# Patient Record
Sex: Male | Born: 1960
Health system: Southern US, Community
[De-identification: ages and names within clinical notes are randomized; demographics above are authoritative.]

## PROBLEM LIST (undated history)

## (undated) DIAGNOSIS — B029 Zoster without complications: Secondary | ICD-10-CM

## (undated) DIAGNOSIS — M1710 Unilateral primary osteoarthritis, unspecified knee: Secondary | ICD-10-CM

## (undated) DIAGNOSIS — R591 Generalized enlarged lymph nodes: Secondary | ICD-10-CM

## (undated) DIAGNOSIS — Z8719 Personal history of other diseases of the digestive system: Secondary | ICD-10-CM

## (undated) DIAGNOSIS — Z77090 Contact with and (suspected) exposure to asbestos: Secondary | ICD-10-CM

## (undated) DIAGNOSIS — G43909 Migraine, unspecified, not intractable, without status migrainosus: Secondary | ICD-10-CM

## (undated) DIAGNOSIS — J309 Allergic rhinitis, unspecified: Secondary | ICD-10-CM

## (undated) DIAGNOSIS — I10 Essential (primary) hypertension: Secondary | ICD-10-CM

## (undated) DIAGNOSIS — E669 Obesity, unspecified: Secondary | ICD-10-CM

## (undated) DIAGNOSIS — M109 Gout, unspecified: Secondary | ICD-10-CM

## (undated) DIAGNOSIS — M179 Osteoarthritis of knee, unspecified: Secondary | ICD-10-CM

## (undated) DIAGNOSIS — N200 Calculus of kidney: Secondary | ICD-10-CM

## (undated) DIAGNOSIS — F419 Anxiety disorder, unspecified: Secondary | ICD-10-CM

## (undated) DIAGNOSIS — G4730 Sleep apnea, unspecified: Secondary | ICD-10-CM

## (undated) DIAGNOSIS — Z8711 Personal history of peptic ulcer disease: Secondary | ICD-10-CM

## (undated) DIAGNOSIS — J45909 Unspecified asthma, uncomplicated: Secondary | ICD-10-CM

## (undated) DIAGNOSIS — D860 Sarcoidosis of lung: Secondary | ICD-10-CM

## (undated) DIAGNOSIS — J40 Bronchitis, not specified as acute or chronic: Secondary | ICD-10-CM

## (undated) DIAGNOSIS — S4290XA Fracture of unspecified shoulder girdle, part unspecified, initial encounter for closed fracture: Secondary | ICD-10-CM

## (undated) HISTORY — DX: Anxiety disorder, unspecified: F41.9

## (undated) HISTORY — DX: Sleep apnea, unspecified: G47.30

## (undated) HISTORY — DX: Contact with and (suspected) exposure to asbestos: Z77.090

## (undated) HISTORY — DX: Allergic rhinitis, unspecified: J30.9

## (undated) HISTORY — DX: Gout, unspecified: M10.9

## (undated) HISTORY — DX: Personal history of other diseases of the digestive system: Z87.19

## (undated) HISTORY — PX: TONSILLECTOMY: SUR1361

## (undated) HISTORY — DX: Zoster without complications: B02.9

## (undated) HISTORY — DX: Obesity, unspecified: E66.9

## (undated) HISTORY — DX: Osteoarthritis of knee, unspecified: M17.9

## (undated) HISTORY — PX: OTHER SURGICAL HISTORY: SHX169

## (undated) HISTORY — DX: Calculus of kidney: N20.0

## (undated) HISTORY — DX: Personal history of peptic ulcer disease: Z87.11

## (undated) HISTORY — PX: POLYPECTOMY: SHX149

## (undated) HISTORY — DX: Unilateral primary osteoarthritis, unspecified knee: M17.10

## (undated) HISTORY — PX: WISDOM TOOTH EXTRACTION: SHX21

## (undated) HISTORY — PX: TESTICLE SURGERY: SHX794

## (undated) HISTORY — DX: Essential (primary) hypertension: I10

---

## 1998-12-10 ENCOUNTER — Encounter: Payer: Self-pay | Admitting: Emergency Medicine

## 1998-12-10 ENCOUNTER — Emergency Department (HOSPITAL_COMMUNITY): Admission: EM | Admit: 1998-12-10 | Discharge: 1998-12-10 | Payer: Self-pay | Admitting: Emergency Medicine

## 1998-12-20 ENCOUNTER — Encounter: Admission: RE | Admit: 1998-12-20 | Discharge: 1998-12-20 | Payer: Self-pay | Admitting: *Deleted

## 1999-10-11 ENCOUNTER — Emergency Department (HOSPITAL_COMMUNITY): Admission: EM | Admit: 1999-10-11 | Discharge: 1999-10-11 | Payer: Self-pay | Admitting: Emergency Medicine

## 1999-10-11 ENCOUNTER — Encounter: Payer: Self-pay | Admitting: Emergency Medicine

## 1999-10-14 ENCOUNTER — Encounter: Payer: Self-pay | Admitting: *Deleted

## 1999-10-15 ENCOUNTER — Inpatient Hospital Stay (HOSPITAL_COMMUNITY): Admission: EM | Admit: 1999-10-15 | Discharge: 1999-10-16 | Payer: Self-pay | Admitting: Pediatrics

## 1999-10-15 ENCOUNTER — Encounter: Payer: Self-pay | Admitting: *Deleted

## 1999-10-17 ENCOUNTER — Encounter: Payer: Self-pay | Admitting: *Deleted

## 1999-10-17 ENCOUNTER — Ambulatory Visit (HOSPITAL_COMMUNITY): Admission: RE | Admit: 1999-10-17 | Discharge: 1999-10-17 | Payer: Self-pay | Admitting: *Deleted

## 1999-10-25 ENCOUNTER — Ambulatory Visit (HOSPITAL_COMMUNITY): Admission: RE | Admit: 1999-10-25 | Discharge: 1999-10-25 | Payer: Self-pay | Admitting: *Deleted

## 1999-10-25 ENCOUNTER — Encounter: Payer: Self-pay | Admitting: *Deleted

## 1999-12-02 ENCOUNTER — Encounter: Payer: Self-pay | Admitting: *Deleted

## 1999-12-02 ENCOUNTER — Ambulatory Visit (HOSPITAL_COMMUNITY): Admission: RE | Admit: 1999-12-02 | Discharge: 1999-12-02 | Payer: Self-pay | Admitting: *Deleted

## 2000-05-30 ENCOUNTER — Encounter: Payer: Self-pay | Admitting: Emergency Medicine

## 2000-05-30 ENCOUNTER — Emergency Department (HOSPITAL_COMMUNITY): Admission: EM | Admit: 2000-05-30 | Discharge: 2000-05-30 | Payer: Self-pay | Admitting: Emergency Medicine

## 2000-06-04 ENCOUNTER — Encounter: Payer: Self-pay | Admitting: *Deleted

## 2000-06-04 ENCOUNTER — Encounter: Admission: RE | Admit: 2000-06-04 | Discharge: 2000-06-04 | Payer: Self-pay | Admitting: *Deleted

## 2003-02-21 ENCOUNTER — Emergency Department (HOSPITAL_COMMUNITY): Admission: EM | Admit: 2003-02-21 | Discharge: 2003-02-21 | Payer: Self-pay | Admitting: Emergency Medicine

## 2004-09-26 ENCOUNTER — Emergency Department (HOSPITAL_COMMUNITY): Admission: EM | Admit: 2004-09-26 | Discharge: 2004-09-26 | Payer: Self-pay | Admitting: Emergency Medicine

## 2006-02-14 ENCOUNTER — Emergency Department (HOSPITAL_COMMUNITY): Admission: EM | Admit: 2006-02-14 | Discharge: 2006-02-14 | Payer: Self-pay | Admitting: Emergency Medicine

## 2012-01-23 ENCOUNTER — Institutional Professional Consult (permissible substitution) (INDEPENDENT_AMBULATORY_CARE_PROVIDER_SITE_OTHER): Payer: BC Managed Care – PPO | Admitting: Thoracic Surgery (Cardiothoracic Vascular Surgery)

## 2012-01-23 ENCOUNTER — Other Ambulatory Visit: Payer: Self-pay | Admitting: *Deleted

## 2012-01-23 ENCOUNTER — Other Ambulatory Visit: Payer: Self-pay

## 2012-01-23 VITALS — BP 150/97 | HR 124 | Resp 20 | Ht 69.0 in | Wt 266.0 lb

## 2012-01-23 DIAGNOSIS — R599 Enlarged lymph nodes, unspecified: Secondary | ICD-10-CM

## 2012-01-23 DIAGNOSIS — R590 Localized enlarged lymph nodes: Secondary | ICD-10-CM

## 2012-01-23 NOTE — Progress Notes (Signed)
PCP is Ezequiel Kayser, MD Referring Provider is Perini, [facility], MD  Chief Complaint  Patient presents with  . Lymphadenopathy    Referral from Dr Waynard Edwards for surgical eval on mediastinal and bilateral hilar lymphadenopathy, Chest CT 01/08/12    HPI: 51 year old male presents with chief complaint of enlarged lymph nodes.  Mr. Hathorne is a 51 year old with a moderate history of tobacco use and some were placed related asbestos exposure 25 years ago. He was in his usual state of health until this past spring when he was noted frequent cough, wheezing, and eye irritation. This was unusual for him as he has never had allergies prior to that time. He went to an urgent care was told was allergies. He was given an inhaler, which did improve his symptoms. But he would still wheeze at night and often snored louder than usual. His cough also never really resolved. If I would see Dr. Waynard Edwards. Chest x-ray was done which showed prominent hila bilaterally. This led to a CT of the chest on 01/08/2012 which showed extensive bilateral hilar and mediastinal adenopathy, as well as a mild lingular infiltrate.  He denies fevers, chills, and night sweats. He does complain of a tight feeling in his chest. He says he gets this sensation at the end of the day when he is tired. It is not exertion related. It usually is 2-3 on a scale of 10. He does seem to have more that sensation when he's been coughing frequently. He has no history of cardiac disease. He does not have any exposures to birds, construction sites or unusual travel locations. His weight has been stable.   Past Medical History  Diagnosis Date  . Shingles     on face, age 36yo and again at 51yo   . Gout   . Nephrolithiasis   . OA (osteoarthritis) of knee     bilat  . Allergic rhinitis   . Hypertension   . Anxiety   . History of stomach ulcers   . Obesity   . H/O asbestos exposure     Past Surgical History  Procedure Date  . Kidney stones   . Testicle  surgery   . Tonsillectomy     Family History  Problem Relation Age of Onset  . COPD Mother   . COPD Father   . Hypertension Mother   . Hypertension Father   . Hypertension Brother     Social History History  Substance Use Topics  . Smoking status: Former Smoker -- 1.5 packs/day for 20 years    Types: Cigarettes    Quit date: 04/17/2009  . Smokeless tobacco: Never Used  . Alcohol Use: Yes     rare    Current Outpatient Prescriptions  Medication Sig Dispense Refill  . allopurinol (ZYLOPRIM) 300 MG tablet Take 300 mg by mouth daily.       Marland Kitchen amLODipine (NORVASC) 5 MG tablet Take 5 mg by mouth daily.       . fenofibrate 160 MG tablet Take 160 mg by mouth daily.       Marland Kitchen losartan (COZAAR) 100 MG tablet Take 100 mg by mouth daily.       Marland Kitchen PROAIR HFA 108 (90 BASE) MCG/ACT inhaler Inhale 2 puffs into the lungs every 6 (six) hours as needed.       Marland Kitchen QVAR 80 MCG/ACT inhaler Inhale 2 puffs into the lungs 2 (two) times daily.         No Known Allergies  Review  of Systems  Respiratory: Positive for cough, chest tightness (center of chest at end of day when I'm tired, not exertional) and wheezing (since this past Spring).        Exposure to asbestos in workplace 25 years ago for 1.5 years  Psychiatric/Behavioral:       Very anxious about CT findings  All other systems reviewed and are negative.    BP 150/97  Pulse 124  Resp 20  Ht 5\' 9"  (1.753 m)  Wt 266 lb (120.657 kg)  BMI 39.28 kg/m2  SpO2 96% Physical Exam  Vitals reviewed. Constitutional: He is oriented to person, place, and time. He appears well-developed and well-nourished. No distress.  HENT:  Head: Normocephalic and atraumatic.  Eyes: EOM are normal. Pupils are equal, round, and reactive to light.  Neck: Neck supple. No thyromegaly present.  Cardiovascular: Normal rate, regular rhythm, normal heart sounds and intact distal pulses.  Exam reveals no gallop and no friction rub.   No murmur heard. Pulmonary/Chest:  Effort normal and breath sounds normal. No respiratory distress. He has no wheezes.  Abdominal: Soft. There is no tenderness.  Musculoskeletal: He exhibits no edema.  Lymphadenopathy:    He has no cervical adenopathy.  Neurological: He is alert and oriented to person, place, and time. No cranial nerve deficit.  Skin: Skin is warm and dry.     Diagnostic Tests: Ct Chest 01/08/12 Impression: 1. Extensive mediastinal and bilateral hilar lymphadenopathy, or severely involving the subcarinal region. Findings are nonspecific. Differential considerations include : lymphoma and other immunoproliferative disorders, metastatic disease, inflammatory processes such as sarcoidosis as well as reactive infectious adenopathy. Pathologic correlation/biopsy is needed. 2. Mild lingular infiltrate    Impression: 51 year old male with mediastinal and bilateral hilar adenopathy. The differential diagnosis for this includes lymphoma, sarcoidosis, TB or fungal infection, metastatic disease, and reactive adenopathy due to infection or inflammatory process. Of these I think lymphoma and sarcoidosis of the 2 main issues in the differential, with sarcoidosis being the most likely. Because of this I think the best way to approach this diagnostic workup would be with mediastinoscopy. EBUS could access the nodes, but is unlikely to yield either of the 2 most likely diagnoses. On the other hand with mediastinoscopy we can obtain large enough tissue samples to get a definitive diagnosis.  I discussed in detail with the patient and Mrs. Kittrell the indications, risks, benefits, and alternatives for mediastinoscopy. They understand the general nature of the procedure, the incision to be used, the need for general anesthesia, the expectation for this being an outpatient procedure as well as being able to return to work within a few days. I discussed in detail with them the risks which they understand include, but are not limited to,  death, stroke, bleeding, possible need for transfusion or thoracotomy, recurrent nerve injury resulting in hoarseness, pneumothorax, esophageal injury, as well as the possibility of unforeseeable complications. He accepts these risks and wishes to proceed.   Plan: Mediastinoscopy as outpatient on Monday, October 21.

## 2012-01-26 ENCOUNTER — Encounter (HOSPITAL_COMMUNITY): Payer: Self-pay | Admitting: Pharmacy Technician

## 2012-01-31 ENCOUNTER — Encounter (HOSPITAL_COMMUNITY)
Admission: RE | Admit: 2012-01-31 | Discharge: 2012-01-31 | Disposition: A | Discharge status: Home / Self Care | Payer: BC Managed Care – PPO | Source: Ambulatory Visit | Attending: Thoracic Surgery (Cardiothoracic Vascular Surgery) | Admitting: Thoracic Surgery (Cardiothoracic Vascular Surgery)

## 2012-01-31 ENCOUNTER — Encounter (HOSPITAL_COMMUNITY): Payer: Self-pay

## 2012-01-31 VITALS — BP 132/85 | HR 110 | Temp 99.0°F | Resp 20 | Ht 69.0 in | Wt 274.1 lb

## 2012-01-31 DIAGNOSIS — R590 Localized enlarged lymph nodes: Secondary | ICD-10-CM

## 2012-01-31 HISTORY — DX: Migraine, unspecified, not intractable, without status migrainosus: G43.909

## 2012-01-31 HISTORY — DX: Unspecified asthma, uncomplicated: J45.909

## 2012-01-31 HISTORY — DX: Fracture of unspecified shoulder girdle, part unspecified, initial encounter for closed fracture: S42.90XA

## 2012-01-31 HISTORY — DX: Bronchitis, not specified as acute or chronic: J40

## 2012-01-31 LAB — COMPREHENSIVE METABOLIC PANEL
ALT: 43 U/L (ref 0–53)
AST: 26 U/L (ref 0–37)
Albumin: 3.5 g/dL (ref 3.5–5.2)
Alkaline Phosphatase: 69 U/L (ref 39–117)
BUN: 13 mg/dL (ref 6–23)
Chloride: 103 mEq/L (ref 96–112)
Potassium: 4.2 mEq/L (ref 3.5–5.1)
Sodium: 137 mEq/L (ref 135–145)
Total Bilirubin: 1.1 mg/dL (ref 0.3–1.2)
Total Protein: 6.8 g/dL (ref 6.0–8.3)

## 2012-01-31 LAB — CBC
HCT: 45.6 % (ref 39.0–52.0)
MCHC: 34.2 g/dL (ref 30.0–36.0)
RDW: 14.6 % (ref 11.5–15.5)
WBC: 7.3 10*3/uL (ref 4.0–10.5)

## 2012-01-31 LAB — TYPE AND SCREEN: Antibody Screen: NEGATIVE

## 2012-01-31 LAB — SURGICAL PCR SCREEN
MRSA, PCR: NEGATIVE
Staphylococcus aureus: POSITIVE — AB

## 2012-01-31 LAB — ABO/RH: ABO/RH(D): O POS

## 2012-01-31 LAB — PROTIME-INR: Prothrombin Time: 13.2 seconds (ref 11.6–15.2)

## 2012-01-31 LAB — APTT: aPTT: 31 seconds (ref 24–37)

## 2012-01-31 NOTE — Pre-Procedure Instructions (Signed)
[facility]  01/31/2012   Your procedure is scheduled on:  Monday February 05, 2012.  [facility] at 0530 AM.  Call this number if you have problems the morning of surgery: [phone]   Remember:   Do not eat food or drink:After Midnight.    Take these medicines the morning of surgery with A SIP OF WATER: Amlodipine (Norvasc), Proair inhaler if needed for shortness of breath, and Qvar inhaler   Do not wear jewelry  Do not wear lotions or colognes.  Men may shave face and neck.  Do not bring valuables to the hospital.  Contacts, dentures or bridgework may not be worn into surgery.  Leave suitcase in the car. After surgery it may be brought to your room.  For patients admitted to the hospital, checkout time is 11:00 AM the day of discharge.   Patients discharged the day of surgery will not be allowed to drive home.  Name and phone number of your driver:   Special Instructions: Shower using CHG 2 nights before surgery and the night before surgery.  If you shower the day of surgery use CHG.  Use special wash - you have one bottle of CHG for all showers.  You should use approximately 1/3 of the bottle for each shower.   Please read over the following fact sheets that you were given: Pain Booklet, Coughing and Deep Breathing, Blood Transfusion Information, MRSA Information and Surgical Site Infection Prevention

## 2012-01-31 NOTE — Progress Notes (Signed)
Patient denied having a stress test, cardiac cath, or sleep study. PCP is Rodrigo Ran at [facility] office # [phone] and LOV was approximately 3 weeks ago. Will request LOV and EKG. Chest Xray will be performed on DOS per orders.

## 2012-02-04 MED ORDER — DEXTROSE 5 % IV SOLN
1.5000 g | INTRAVENOUS | Status: AC
  Administered 2012-02-05: 1.5 g via INTRAVENOUS
  Filled 2012-02-04: qty 1.5

## 2012-02-05 ENCOUNTER — Ambulatory Visit (HOSPITAL_COMMUNITY)
Admission: RE | Admit: 2012-02-05 | Discharge: 2012-02-05 | Disposition: A | Discharge status: Home / Self Care | Payer: BC Managed Care – PPO | Source: Ambulatory Visit | Attending: Thoracic Surgery (Cardiothoracic Vascular Surgery) | Admitting: Thoracic Surgery (Cardiothoracic Vascular Surgery)

## 2012-02-05 ENCOUNTER — Encounter (HOSPITAL_COMMUNITY)
Admission: RE | Disposition: A | Discharge status: Home / Self Care | Payer: Self-pay | Source: Ambulatory Visit | Attending: Thoracic Surgery (Cardiothoracic Vascular Surgery)

## 2012-02-05 ENCOUNTER — Encounter (HOSPITAL_COMMUNITY): Payer: Self-pay | Admitting: Anesthesiology

## 2012-02-05 ENCOUNTER — Ambulatory Visit (HOSPITAL_COMMUNITY): Payer: BC Managed Care – PPO

## 2012-02-05 ENCOUNTER — Ambulatory Visit (HOSPITAL_COMMUNITY): Payer: BC Managed Care – PPO | Admitting: Anesthesiology

## 2012-02-05 DIAGNOSIS — Z01812 Encounter for preprocedural laboratory examination: Secondary | ICD-10-CM | POA: Insufficient documentation

## 2012-02-05 DIAGNOSIS — R599 Enlarged lymph nodes, unspecified: Secondary | ICD-10-CM | POA: Insufficient documentation

## 2012-02-05 DIAGNOSIS — I889 Nonspecific lymphadenitis, unspecified: Secondary | ICD-10-CM | POA: Insufficient documentation

## 2012-02-05 DIAGNOSIS — R590 Localized enlarged lymph nodes: Secondary | ICD-10-CM

## 2012-02-05 HISTORY — PX: MEDIASTINOSCOPY: SHX5086

## 2012-02-05 LAB — GLUCOSE, CAPILLARY: Glucose-Capillary: 134 mg/dL — ABNORMAL HIGH (ref 70–99)

## 2012-02-05 SURGERY — MEDIASTINOSCOPY
Anesthesia: General | Wound class: Clean Contaminated

## 2012-02-05 MED ORDER — PROPOFOL 10 MG/ML IV BOLUS
INTRAVENOUS | Status: DC | PRN
  Administered 2012-02-05: 160 mg via INTRAVENOUS

## 2012-02-05 MED ORDER — OXYCODONE HCL 5 MG PO TABS
5.0000 mg | ORAL_TABLET | Freq: Once | ORAL | Status: AC | PRN
  Administered 2012-02-05: 5 mg via ORAL

## 2012-02-05 MED ORDER — PHENYLEPHRINE HCL 10 MG/ML IJ SOLN
INTRAMUSCULAR | Status: DC | PRN
  Administered 2012-02-05: 40 ug via INTRAVENOUS
  Administered 2012-02-05 (×4): 80 ug via INTRAVENOUS
  Administered 2012-02-05: 40 ug via INTRAVENOUS

## 2012-02-05 MED ORDER — FENTANYL CITRATE 0.05 MG/ML IJ SOLN: 50.0000 ug | Freq: Once | INTRAMUSCULAR | Status: DC

## 2012-02-05 MED ORDER — ACETAMINOPHEN 10 MG/ML IV SOLN: 1000.0000 mg | Freq: Once | INTRAVENOUS | Status: DC

## 2012-02-05 MED ORDER — ESMOLOL HCL 10 MG/ML IV SOLN
INTRAVENOUS | Status: DC | PRN
  Administered 2012-02-05 (×3): 20 mg via INTRAVENOUS

## 2012-02-05 MED ORDER — LACTATED RINGERS IV SOLN
INTRAVENOUS | Status: DC | PRN
  Administered 2012-02-05: 07:00:00 via INTRAVENOUS

## 2012-02-05 MED ORDER — OXYCODONE HCL 5 MG PO TABS
ORAL_TABLET | ORAL | Status: AC
  Filled 2012-02-05: qty 1

## 2012-02-05 MED ORDER — HYDROMORPHONE HCL PF 1 MG/ML IJ SOLN: 0.2500 mg | INTRAMUSCULAR | Status: DC | PRN

## 2012-02-05 MED ORDER — PROMETHAZINE HCL 25 MG/ML IJ SOLN: 6.2500 mg | INTRAMUSCULAR | Status: DC | PRN

## 2012-02-05 MED ORDER — LIDOCAINE HCL (CARDIAC) 20 MG/ML IV SOLN
INTRAVENOUS | Status: DC | PRN
  Administered 2012-02-05: 100 mg via INTRAVENOUS

## 2012-02-05 MED ORDER — MIDAZOLAM HCL 2 MG/2ML IJ SOLN: 1.0000 mg | INTRAMUSCULAR | Status: DC | PRN

## 2012-02-05 MED ORDER — OXYCODONE HCL 5 MG PO TABS: 5.0000 mg | ORAL_TABLET | Freq: Four times a day (QID) | ORAL | Status: DC | PRN

## 2012-02-05 MED ORDER — INSULIN ASPART 100 UNIT/ML ~~LOC~~ SOLN
10.0000 [IU] | SUBCUTANEOUS | Status: DC
  Filled 2012-02-05: qty 3

## 2012-02-05 MED ORDER — MIDAZOLAM HCL 5 MG/5ML IJ SOLN
INTRAMUSCULAR | Status: DC | PRN
  Administered 2012-02-05 (×2): 1 mg via INTRAVENOUS

## 2012-02-05 MED ORDER — ARTIFICIAL TEARS OP OINT
TOPICAL_OINTMENT | OPHTHALMIC | Status: DC | PRN
  Administered 2012-02-05: 1 via OPHTHALMIC

## 2012-02-05 MED ORDER — OXYCODONE HCL 5 MG/5ML PO SOLN: 5.0000 mg | Freq: Once | ORAL | Status: AC | PRN

## 2012-02-05 MED ORDER — ONDANSETRON HCL 4 MG/2ML IJ SOLN
INTRAMUSCULAR | Status: DC | PRN
  Administered 2012-02-05: 4 mg via INTRAVENOUS

## 2012-02-05 MED ORDER — GLYCOPYRROLATE 0.2 MG/ML IJ SOLN
INTRAMUSCULAR | Status: DC | PRN
  Administered 2012-02-05: .8 mg via INTRAVENOUS

## 2012-02-05 MED ORDER — SODIUM CHLORIDE 0.9 % IR SOLN
Status: DC | PRN
  Administered 2012-02-05: 1000 mL

## 2012-02-05 MED ORDER — ROCURONIUM BROMIDE 100 MG/10ML IV SOLN
INTRAVENOUS | Status: DC | PRN
  Administered 2012-02-05: 50 mg via INTRAVENOUS

## 2012-02-05 MED ORDER — FENTANYL CITRATE 0.05 MG/ML IJ SOLN
INTRAMUSCULAR | Status: DC | PRN
  Administered 2012-02-05 (×3): 50 ug via INTRAVENOUS
  Administered 2012-02-05: 25 ug via INTRAVENOUS
  Administered 2012-02-05: 75 ug via INTRAVENOUS

## 2012-02-05 MED ORDER — NEOSTIGMINE METHYLSULFATE 1 MG/ML IJ SOLN
INTRAMUSCULAR | Status: DC | PRN
  Administered 2012-02-05: 5 mg via INTRAVENOUS

## 2012-02-05 SURGICAL SUPPLY — 47 items
APPLIER CLIP LOGIC TI 5 (MISCELLANEOUS) IMPLANT
BLADE SURG 15 STRL LF DISP TIS (BLADE) ×1 IMPLANT
BLADE SURG 15 STRL SS (BLADE) ×1
CANISTER SUCTION 2500CC (MISCELLANEOUS) ×2 IMPLANT
CLIP TI MEDIUM 6 (CLIP) ×2 IMPLANT
CLOTH BEACON ORANGE TIMEOUT ST (SAFETY) ×2 IMPLANT
CONT SPEC 4OZ CLIKSEAL STRL BL (MISCELLANEOUS) ×12 IMPLANT
COVER SURGICAL LIGHT HANDLE (MISCELLANEOUS) ×2 IMPLANT
DERMABOND ADVANCED (GAUZE/BANDAGES/DRESSINGS) ×1
DERMABOND ADVANCED .7 DNX12 (GAUZE/BANDAGES/DRESSINGS) ×1 IMPLANT
DRAPE CHEST BREAST 15X10 FENES (DRAPES) ×2 IMPLANT
DRAPE EXTREMITY T 121X128X90 (DRAPE) ×2 IMPLANT
DRAPE LAPAROTOMY T 102X78X121 (DRAPES) IMPLANT
ELECT REM PT RETURN 9FT ADLT (ELECTROSURGICAL) ×2
ELECTRODE REM PT RTRN 9FT ADLT (ELECTROSURGICAL) ×1 IMPLANT
GAUZE SPONGE 4X4 16PLY XRAY LF (GAUZE/BANDAGES/DRESSINGS) ×2 IMPLANT
GLOVE BIOGEL PI IND STRL 6.5 (GLOVE) ×1 IMPLANT
GLOVE BIOGEL PI IND STRL 7.5 (GLOVE) ×1 IMPLANT
GLOVE BIOGEL PI INDICATOR 6.5 (GLOVE) ×1
GLOVE BIOGEL PI INDICATOR 7.5 (GLOVE) ×1
GLOVE EUDERMIC 7 POWDERFREE (GLOVE) ×2 IMPLANT
GOWN PREVENTION PLUS XLARGE (GOWN DISPOSABLE) ×2 IMPLANT
GOWN STRL NON-REIN LRG LVL3 (GOWN DISPOSABLE) ×2 IMPLANT
HEMOSTAT SURGICEL 2X14 (HEMOSTASIS) IMPLANT
KIT BASIN OR (CUSTOM PROCEDURE TRAY) ×2 IMPLANT
KIT ROOM TURNOVER OR (KITS) ×2 IMPLANT
NS IRRIG 1000ML POUR BTL (IV SOLUTION) ×2 IMPLANT
PACK SURGICAL SETUP 50X90 (CUSTOM PROCEDURE TRAY) ×2 IMPLANT
PAD ARMBOARD 7.5X6 YLW CONV (MISCELLANEOUS) ×4 IMPLANT
PENCIL BUTTON HOLSTER BLD 10FT (ELECTRODE) ×2 IMPLANT
SPONGE GAUZE 4X4 12PLY (GAUZE/BANDAGES/DRESSINGS) ×2 IMPLANT
SPONGE INTESTINAL PEANUT (DISPOSABLE) ×2 IMPLANT
SUT SILK 2 0 TIES 10X30 (SUTURE) IMPLANT
SUT VIC AB 2-0 CT1 27 (SUTURE) ×1
SUT VIC AB 2-0 CT1 TAPERPNT 27 (SUTURE) ×1 IMPLANT
SUT VIC AB 3-0 SH 18 (SUTURE) IMPLANT
SUT VIC AB 3-0 SH 27 (SUTURE) ×1
SUT VIC AB 3-0 SH 27X BRD (SUTURE) ×1 IMPLANT
SUT VICRYL 4-0 PS2 18IN ABS (SUTURE) ×2 IMPLANT
SWAB COLLECTION DEVICE MRSA (MISCELLANEOUS) IMPLANT
SYRINGE 10CC LL (SYRINGE) ×2 IMPLANT
TOWEL OR 17X24 6PK STRL BLUE (TOWEL DISPOSABLE) ×2 IMPLANT
TOWEL OR 17X26 10 PK STRL BLUE (TOWEL DISPOSABLE) IMPLANT
TRAY FOLEY IC TEMP SENS 14FR (CATHETERS) IMPLANT
TUBE ANAEROBIC SPECIMEN COL (MISCELLANEOUS) IMPLANT
TUBE CONNECTING 12X1/4 (SUCTIONS) ×2 IMPLANT
WATER STERILE IRR 1000ML POUR (IV SOLUTION) IMPLANT

## 2012-02-05 NOTE — Anesthesia Postprocedure Evaluation (Signed)
  Anesthesia Post-op Note  Patient: Patrick Hoffman  Procedure(s) Performed: Procedure(s) (LRB) with comments: MEDIASTINOSCOPY (N/A)  Patient Location: PACU  Anesthesia Type: General  Level of Consciousness: awake and alert   Airway and Oxygen Therapy: Patient Spontanous Breathing  Post-op Pain: mild  Post-op Assessment: Post-op Vital signs reviewed, Patient's Cardiovascular Status Stable, Respiratory Function Stable, Patent Airway, No signs of Nausea or vomiting and Pain level controlled  Post-op Vital Signs: stable  Complications: No apparent anesthesia complications

## 2012-02-05 NOTE — Anesthesia Procedure Notes (Signed)
Procedure Name: Intubation Date/Time: 02/05/2012 7:50 AM Performed by: Gayla Medicus Pre-anesthesia Checklist: Timeout performed, Patient identified, Emergency Drugs available, Suction available and Patient being monitored Patient Re-evaluated:Patient Re-evaluated prior to inductionOxygen Delivery Method: Circle system utilized Preoxygenation: Pre-oxygenation with 100% oxygen Intubation Type: IV induction Ventilation: Mask ventilation without difficulty and Oral airway inserted - appropriate to patient size Laryngoscope Size: Mac and 3 Grade View: Grade II Tube type: Oral Tube size: 7.5 mm Number of attempts: 1 Airway Equipment and Method: Stylet Placement Confirmation: ETT inserted through vocal cords under direct vision,  positive ETCO2 and breath sounds checked- equal and bilateral Secured at: 23 cm Tube secured with: Tape Dental Injury: Teeth and Oropharynx as per pre-operative assessment

## 2012-02-05 NOTE — Transfer of Care (Signed)
Immediate Anesthesia Transfer of Care Note  Patient: Patrick Hoffman  Procedure(s) Performed: Procedure(s) (LRB) with comments: MEDIASTINOSCOPY (N/A)  Patient Location: PACU  Anesthesia Type: General  Level of Consciousness: awake, alert  and oriented  Airway & Oxygen Therapy: Patient Spontanous Breathing and Patient connected to face mask oxygen  Post-op Assessment: Report given to PACU RN, Post -op Vital signs reviewed and stable and Patient moving all extremities X 4  Post vital signs: Reviewed and stable  Complications: No apparent anesthesia complications

## 2012-02-05 NOTE — Brief Op Note (Addendum)
02/05/2012  9:21 AM  PATIENT:  Megan Mans  51 y.o. male  PRE-OPERATIVE DIAGNOSIS:  mediastinal adenopathy  POST-OPERATIVE DIAGNOSIS:  Mediastinal Adenopathy  PROCEDURE:  Procedure(s) (LRB) with comments: MEDIASTINOSCOPY (N/A)  SURGEON:  Surgeon(s) and Role:    * Loreli Slot, MD - Primary  ANESTHESIA:   general  EBL:  Total I/O In: 1400 [I.V.:1400] Out: -   BLOOD ADMINISTERED:none  DRAINS: none   LOCAL MEDICATIONS USED:  NONE  SPECIMEN:  Source of Specimen:  4R and 7 lymph nodes  DISPOSITION OF SPECIMEN:  path and micro  COUNTS:  YES  PLAN OF CARE: Discharge to home after PACU  PATIENT DISPOSITION:  PACU - hemodynamically stable.   Delay start of Pharmacological VTE agent (>24hrs) due to surgical blood loss or risk of bleeding: not applicable   FROZEN SECTION- benign node, granuloma, probable sarcoid

## 2012-02-05 NOTE — Preoperative (Signed)
Beta Blockers   Reason not to administer Beta Blockers: not prescribed 

## 2012-02-05 NOTE — Anesthesia Preprocedure Evaluation (Addendum)
Anesthesia Evaluation  Patient identified by MRN, date of birth, ID band Patient awake    Reviewed: Allergy & Precautions, H&P , NPO status , Patient's Chart, lab work & pertinent test results  Airway Mallampati: II TM Distance: >3 FB Neck ROM: Full    Dental   Pulmonary asthma ,  breath sounds clear to auscultation        Cardiovascular hypertension, Rhythm:Regular Rate:Normal     Neuro/Psych  Headaches, Anxiety    GI/Hepatic   Endo/Other  diabetes  Renal/GU      Musculoskeletal   Abdominal (+) + obese,   Peds  Hematology   Anesthesia Other Findings   Reproductive/Obstetrics                           Anesthesia Physical Anesthesia Plan  ASA: III  Anesthesia Plan: General   Post-op Pain Management:    Induction: Intravenous  Airway Management Planned: Oral ETT  Additional Equipment: Arterial line  Intra-op Plan:   Post-operative Plan: Extubation in OR  Informed Consent: I have reviewed the patients History and Physical, chart, labs and discussed the procedure including the risks, benefits and alternatives for the proposed anesthesia with the patient or authorized representative who has indicated his/her understanding and acceptance.     Plan Discussed with: CRNA and Surgeon  Anesthesia Plan Comments:         Anesthesia Quick Evaluation

## 2012-02-05 NOTE — Interval H&P Note (Signed)
History and Physical Interval Note:  02/05/2012 7:36 AM  Patrick Hoffman  has presented today for surgery, with the diagnosis of mediastinal adenopathy  The various methods of treatment have been discussed with the patient and family. After consideration of risks, benefits and other options for treatment, the patient has consented to  Procedure(s) (LRB) with comments: MEDIASTINOSCOPY (N/A) as a surgical intervention .  The patient's history has been reviewed, patient examined, no change in status, stable for surgery.  I have reviewed the patient's chart and labs.  Questions were answered to the patient's satisfaction.     Naome Brigandi C

## 2012-02-05 NOTE — H&P (View-Only) (Signed)
PCP is PERINI,MARK A, MD Referring Provider is Perini, Mark A, MD  Chief Complaint  Patient presents with  . Lymphadenopathy    Referral from Dr Perini for surgical eval on mediastinal and bilateral hilar lymphadenopathy, Chest CT 01/08/12    HPI: 51-year-old male presents with chief complaint of enlarged lymph nodes.  Patrick Hoffman is a 51-year-old with a moderate history of tobacco use and some were placed related asbestos exposure 25 years ago. He was in his usual state of health until this past spring when he was noted frequent cough, wheezing, and eye irritation. This was unusual for him as he has never had allergies prior to that time. He went to an urgent care was told was allergies. He was given an inhaler, which did improve his symptoms. But he would still wheeze at night and often snored louder than usual. His cough also never really resolved. If I would see Dr. Perini. Chest x-ray was done which showed prominent hila bilaterally. This led to a CT of the chest on 01/08/2012 which showed extensive bilateral hilar and mediastinal adenopathy, as well as a mild lingular infiltrate.  He denies fevers, chills, and night sweats. He does complain of a tight feeling in his chest. He says he gets this sensation at the end of the day when he is tired. It is not exertion related. It usually is 2-3 on a scale of 10. He does seem to have more that sensation when he's been coughing frequently. He has no history of cardiac disease. He does not have any exposures to birds, construction sites or unusual travel locations. His weight has been stable.   Past Medical History  Diagnosis Date  . Shingles     on face, age 13yo and again at 51yo   . Gout   . Nephrolithiasis   . OA (osteoarthritis) of knee     bilat  . Allergic rhinitis   . Hypertension   . Anxiety   . History of stomach ulcers   . Obesity   . H/O asbestos exposure     Past Surgical History  Procedure Date  . Kidney stones   . Testicle  surgery   . Tonsillectomy     Family History  Problem Relation Age of Onset  . COPD Mother   . COPD Father   . Hypertension Mother   . Hypertension Father   . Hypertension Brother     Social History History  Substance Use Topics  . Smoking status: Former Smoker -- 1.5 packs/day for 20 years    Types: Cigarettes    Quit date: 04/17/2009  . Smokeless tobacco: Never Used  . Alcohol Use: Yes     rare    Current Outpatient Prescriptions  Medication Sig Dispense Refill  . allopurinol (ZYLOPRIM) 300 MG tablet Take 300 mg by mouth daily.       . amLODipine (NORVASC) 5 MG tablet Take 5 mg by mouth daily.       . fenofibrate 160 MG tablet Take 160 mg by mouth daily.       . losartan (COZAAR) 100 MG tablet Take 100 mg by mouth daily.       . PROAIR HFA 108 (90 BASE) MCG/ACT inhaler Inhale 2 puffs into the lungs every 6 (six) hours as needed.       . QVAR 80 MCG/ACT inhaler Inhale 2 puffs into the lungs 2 (two) times daily.         No Known Allergies  Review   of Systems  Respiratory: Positive for cough, chest tightness (center of chest at end of day when I'm tired, not exertional) and wheezing (since this past Spring).        Exposure to asbestos in workplace 25 years ago for 1.5 years  Psychiatric/Behavioral:       Very anxious about CT findings  All other systems reviewed and are negative.    BP 150/97  Pulse 124  Resp 20  Ht 5' 9" (1.753 m)  Wt 266 lb (120.657 kg)  BMI 39.28 kg/m2  SpO2 96% Physical Exam  Vitals reviewed. Constitutional: He is oriented to person, place, and time. He appears well-developed and well-nourished. No distress.  HENT:  Head: Normocephalic and atraumatic.  Eyes: EOM are normal. Pupils are equal, round, and reactive to light.  Neck: Neck supple. No thyromegaly present.  Cardiovascular: Normal rate, regular rhythm, normal heart sounds and intact distal pulses.  Exam reveals no gallop and no friction rub.   No murmur heard. Pulmonary/Chest:  Effort normal and breath sounds normal. No respiratory distress. He has no wheezes.  Abdominal: Soft. There is no tenderness.  Musculoskeletal: He exhibits no edema.  Lymphadenopathy:    He has no cervical adenopathy.  Neurological: He is alert and oriented to person, place, and time. No cranial nerve deficit.  Skin: Skin is warm and dry.     Diagnostic Tests: Ct Chest 01/08/12 Impression: 1. Extensive mediastinal and bilateral hilar lymphadenopathy, or severely involving the subcarinal region. Findings are nonspecific. Differential considerations include : lymphoma and other immunoproliferative disorders, metastatic disease, inflammatory processes such as sarcoidosis as well as reactive infectious adenopathy. Pathologic correlation/biopsy is needed. 2. Mild lingular infiltrate    Impression: 51-year-old male with mediastinal and bilateral hilar adenopathy. The differential diagnosis for this includes lymphoma, sarcoidosis, TB or fungal infection, metastatic disease, and reactive adenopathy due to infection or inflammatory process. Of these I think lymphoma and sarcoidosis of the 2 main issues in the differential, with sarcoidosis being the most likely. Because of this I think the best way to approach this diagnostic workup would be with mediastinoscopy. EBUS could access the nodes, but is unlikely to yield either of the 2 most likely diagnoses. On the other hand with mediastinoscopy we can obtain large enough tissue samples to get a definitive diagnosis.  I discussed in detail with the patient and Patrick Hoffman the indications, risks, benefits, and alternatives for mediastinoscopy. They understand the general nature of the procedure, the incision to be used, the need for general anesthesia, the expectation for this being an outpatient procedure as well as being able to return to work within a few days. I discussed in detail with them the risks which they understand include, but are not limited to,  death, stroke, bleeding, possible need for transfusion or thoracotomy, recurrent nerve injury resulting in hoarseness, pneumothorax, esophageal injury, as well as the possibility of unforeseeable complications. He accepts these risks and wishes to proceed.   Plan: Mediastinoscopy as outpatient on Monday, October 21. 

## 2012-02-05 NOTE — Op Note (Signed)
NAME:  MALEKAI, MARKWOOD NO.:  [id number]  MEDICAL RECORD NO.:  [id number]  LOCATION:  [facility]                         FACILITY:  [facility]  PHYSICIAN:  Salvatore Decent. Dorris Fetch, M.D.DATE OF BIRTH:  11-May-1960  DATE OF PROCEDURE:  02/05/2012 DATE OF DISCHARGE:  02/05/2012                              OPERATIVE REPORT   PREOPERATIVE DIAGNOSIS:  Mediastinal adenopathy.  POSTOPERATIVE DIAGNOSIS:  Mediastinal adenopathy, probable sarcoidosis.  PROCEDURE:  Mediastinoscopy.  SURGEON:  Salvatore Decent. Dorris Fetch, MD  ASSISTANT:  None.  ANESTHESIA:  General.  FINDINGS:  Enlarged 4R and 7 lymph nodes.  Frozen section revealed benign lymph node with some granulomas, probable sarcoid.  CLINICAL NOTE:  Mr. Cronkright is a 51 year old gentleman, who presents with complaints of cough, wheezing, and eye irritation over the past several months.  A CT revealed bilateral hilar and mediastinal adenopathy.  He was advised to undergo mediastinoscopy for biopsy of his mediastinal lymph nodes for diagnostic purposes.  The indications, risks, benefits, and alternatives were discussed in detail with the patient.  He understood and accepted the risks as outlined in the history and physical and agreed to proceed.  OPERATIVE NOTE:  Mr. Cahalan was brought to the preoperative holding area on February 05, 2012.  There Anesthesia established intravenous access and placed an arterial blood pressure monitoring line.  He was taken to the operating room, anesthetized, and intubated.  Intravenous antibiotics were administered.  The chest and neck were prepped and draped in usual sterile fashion.  A transverse incision was made 1 fingerbreadth above the sternal notch.  It was carried through the skin and subcutaneous tissue.  The strap muscles were separated in the midline.  The pretracheal fascia was identified and incised and the pretracheal plane was developed bluntly into the mediastinum. Mediastinoscope  was inserted and systematic inspection of the mediastinal lymph node stations were carried out.  There were enlarged Pinkish, rubbery lymph nodes in the 4R and 7 locations.  The first 4R node was biopsied and sent for frozen section.  Additional samples were taken and sent for AFB and fungal cultures.  The remainder of the node was biopsied and this was sent for permanent sections only.  While awaiting the results of frozen section, a second 4R node was biopsied and sent for permanent section and finally a level 7 node was biopsied and sent for both permanent pathology and AFB and fungal cultures. Hemostasis was achieved with minimal use of electrocautery.  The wound was packed with a sponge for 5 minutes.  In the meantime, the frozen section revealed a benign lymph node with granuloma probably representing sarcoidosis.  The sponge was withdrawn.  The mediastinoscope was reinserted.  There was good hemostasis.  The mediastinoscope was withdrawn.  The platysmal layer was closed with interrupted 3-0 Vicryl sutures and the skin was closed with a 4-0 Vicryl subcuticular suture. Dermabond was applied to the wound.  The patient was extubated in the operating room and taken to postanesthetic care unit in good condition. All sponge, needle, and instrument counts were correct.     Salvatore Decent Dorris Fetch, M.D.     SCH/MEDQ  D:  02/05/2012  T:  02/05/2012  Job:  [id number]

## 2012-02-06 ENCOUNTER — Encounter (HOSPITAL_COMMUNITY): Payer: Self-pay | Admitting: Thoracic Surgery (Cardiothoracic Vascular Surgery)

## 2012-02-15 ENCOUNTER — Institutional Professional Consult (permissible substitution): Payer: BC Managed Care – PPO | Admitting: Internal Medicine

## 2012-02-21 ENCOUNTER — Encounter: Payer: Self-pay | Admitting: Internal Medicine

## 2012-02-21 ENCOUNTER — Ambulatory Visit (INDEPENDENT_AMBULATORY_CARE_PROVIDER_SITE_OTHER): Payer: BC Managed Care – PPO | Admitting: Internal Medicine

## 2012-02-21 ENCOUNTER — Encounter: Payer: Self-pay | Admitting: *Deleted

## 2012-02-21 ENCOUNTER — Ambulatory Visit (INDEPENDENT_AMBULATORY_CARE_PROVIDER_SITE_OTHER): Payer: BC Managed Care – PPO

## 2012-02-21 VITALS — BP 140/90 | HR 117 | Temp 98.8°F | Ht 70.0 in | Wt 271.8 lb

## 2012-02-21 VITALS — BP 149/95 | HR 106 | Temp 100.0°F | Resp 20

## 2012-02-21 DIAGNOSIS — J45991 Cough variant asthma: Secondary | ICD-10-CM | POA: Insufficient documentation

## 2012-02-21 DIAGNOSIS — I2510 Atherosclerotic heart disease of native coronary artery without angina pectoris: Secondary | ICD-10-CM

## 2012-02-21 DIAGNOSIS — R05 Cough: Secondary | ICD-10-CM

## 2012-02-21 DIAGNOSIS — D869 Sarcoidosis, unspecified: Secondary | ICD-10-CM

## 2012-02-21 DIAGNOSIS — T8149XA Infection following a procedure, other surgical site, initial encounter: Secondary | ICD-10-CM

## 2012-02-21 DIAGNOSIS — R059 Cough, unspecified: Secondary | ICD-10-CM

## 2012-02-21 DIAGNOSIS — R509 Fever, unspecified: Secondary | ICD-10-CM

## 2012-02-21 MED ORDER — FAMOTIDINE 20 MG PO TABS: ORAL_TABLET | ORAL | Status: DC

## 2012-02-21 MED ORDER — OMEPRAZOLE 20 MG PO CPDR: 20.0000 mg | DELAYED_RELEASE_CAPSULE | Freq: Every day | ORAL | Status: DC

## 2012-02-21 MED ORDER — PREDNISONE (PAK) 10 MG PO TABS: ORAL_TABLET | ORAL | Status: DC

## 2012-02-21 NOTE — Progress Notes (Signed)
Almira Coaster Collins-PA was paged and given update over the phone about pt's incision and fever. Mr Laforest was unaware of his temp. And not sure of how long he has had fever.  Gina Collins-PA advised to watch temps and manage with Tylenol. If fevers cont. or incision site becomes red with drainage or knot increases in size to call back.  Pt was advised and understands.

## 2012-02-21 NOTE — Progress Notes (Signed)
  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 sarcoid  02/21/2012 1st pulmonary eval cc persistent cough since spring of 2013 ? Better on inhalers (qvar and saba) never rx prednisone dry hacking every other day worse in humid weather worse day than night - 75% better p qvar but using 2-3 x daily. Not really sob unless coughing. No rash, ocular or articular complaints or night sweats/ wt loss.  Sleeping ok without nocturnal  or early am exacerbation  of respiratory  c/o's or need for noct saba. Also denies any obvious fluctuation of symptoms with weather or environmental changes or other aggravating or alleviating factors except as outlined above    Review of Systems  Constitutional: Negative for fever, chills, activity change, appetite change and unexpected weight change.  HENT: Negative for congestion, sore throat, rhinorrhea, sneezing, trouble swallowing, dental problem, voice change and postnasal drip.   Eyes: Negative for visual disturbance.  Respiratory: Positive for cough and shortness of breath. Negative for choking.   Cardiovascular: Positive for chest pain. Negative for leg swelling.  Gastrointestinal: Negative for nausea, vomiting and abdominal pain.  Genitourinary: Negative for difficulty urinating.  Musculoskeletal: Negative for arthralgias.  Skin: Negative for rash.  Psychiatric/Behavioral: Negative for behavioral problems and confusion.       Objective:   Physical Exam   Obese amb wm nad Wt Readings from Last 3 Encounters:  02/21/12 271 lb 12.8 oz (123.288 kg)  01/31/12 274 lb 1.6 oz (124.331 kg)  01/23/12 266 lb (120.657 kg)    HEENT: nl dentition, turbinates, and orophanx. Nl  external ear canals without cough reflex   NECK :  without JVD/Nodes/TM/ nl carotid upstrokes bilaterally   LUNGS: no acc muscle use, clear to A and P bilaterally without cough on insp or exp maneuvers   CV:  RRR  no s3 or murmur or increase in P2, no edema   ABD:  soft and nontender with nl excursion in the supine position. No bruits or organomegaly, bowel sounds nl  MS:  warm without deformities, calf tenderness, cyanosis or clubbing  SKIN: warm and dry without lesions    NEURO:  alert, approp, no deficits  cxr 02/05/12 Classic sarcoid changes       Assessment & Plan:

## 2012-02-21 NOTE — Patient Instructions (Addendum)
Sarcoidosis is a benign inflammatory condition caused by  The  immune system being too revved up like a thermostat on your furnace that's partially  stuck causing arthitis, rash, short of breath and cough and vision issues.  It typically burns itself out in 75% of patients by the end of 3 years with little to indicate that we really change the natural course of the disease by aggressive treatments intended to alter it.  Treatment is generally reserved for patients with major symptoms we can attribute to sarcoid or if a vital organ (like the eye or kidney or nervous system) becomes affected.    qvar 80 Take 2 puffs first thing in am and then another 2 puffs about 12 hours later.   If not improving :  Prednisone 10 mg take  4 each am x 2 days,   2 each am x 2 days,  1 each am x2days and stop   Try prilosec 20mg   Take 30-60 min before first meal of the day and Pepcid 20 mg one bedtime until cough is completely gone for at least a week without the need for cough suppression (take oxycodone)   I think of reflux for chronic cough like I do oxygen for fire (doesn't cause the fire but once you get the oxygen suppressed it usually goes away regardless of the exact cause).   GERD (REFLUX)  is an extremely common cause of respiratory symptoms, many times with no significant heartburn at all.    It can be treated with medication, but also with lifestyle changes including avoidance of late meals, excessive alcohol, smoking cessation, and avoid fatty foods, chocolate, peppermint, colas, red wine, and acidic juices such as orange juice.  NO MINT OR MENTHOL PRODUCTS SO NO COUGH DROPS  USE SUGARLESS CANDY INSTEAD (jolley ranchers or Stover's)  NO OIL BASED VITAMINS - use powdered substitutes.   Late add : needs opth eval  If not yet down  and f/u here in 4-6 weeks with pfts and cxr

## 2012-02-23 DIAGNOSIS — D869 Sarcoidosis, unspecified: Secondary | ICD-10-CM | POA: Insufficient documentation

## 2012-02-23 NOTE — Assessment & Plan Note (Signed)
-   Symptom onset Spring 2013 predominantly cough   - Mediastinal bx POS NCG  02/05/12  This is a classic sarcoid hx and cxr with likely airway involvement causing most of his symptoms and should be ok without the need for systemic rx.  The proper method of use, as well as anticipated side effects, of a metered-dose inhaler are discussed and demonstrated to the patient. Improved effectiveness after extensive coaching during this visit to a level of approximately  75% so would continue qvar in max doses and treat acutely with short course of prednisone then regroup  See instructions for specific recommendations which were reviewed directly with the patient who was given a copy with highlighter outlining the key components.

## 2012-02-27 ENCOUNTER — Telehealth: Payer: Self-pay | Admitting: *Deleted

## 2012-02-27 NOTE — Telephone Encounter (Signed)
Message copied by Christen Butter on Tue Feb 27, 2012  2:22 PM ------      Message from: Sandrea Hughs B      Created: Fri Feb 23, 2012  8:31 AM       Needs opth eval for sarcoid if not already done and f/u here with cxr and pft's in 4-6 weeks (forgot to schedule)

## 2012-02-27 NOTE — Telephone Encounter (Signed)
LMTCB

## 2012-02-28 NOTE — Telephone Encounter (Signed)
LMTCB x2  

## 2012-02-29 NOTE — Telephone Encounter (Signed)
Pt aware of recs per MW and appts were scheduled

## 2012-03-04 LAB — FUNGUS CULTURE W SMEAR
Fungal Smear: NONE SEEN
Fungal Smear: NONE SEEN

## 2012-03-19 LAB — AFB CULTURE WITH SMEAR (NOT AT ARMC)

## 2012-03-25 ENCOUNTER — Ambulatory Visit: Payer: BC Managed Care – PPO | Admitting: Internal Medicine

## 2012-04-08 ENCOUNTER — Ambulatory Visit: Payer: BC Managed Care – PPO | Admitting: Internal Medicine

## 2012-04-16 ENCOUNTER — Other Ambulatory Visit (HOSPITAL_COMMUNITY): Payer: Self-pay | Admitting: Internal Medicine

## 2012-04-16 DIAGNOSIS — D869 Sarcoidosis, unspecified: Secondary | ICD-10-CM

## 2012-04-19 ENCOUNTER — Encounter (HOSPITAL_COMMUNITY): Payer: BC Managed Care – PPO

## 2012-04-22 ENCOUNTER — Ambulatory Visit: Payer: BC Managed Care – PPO | Admitting: Internal Medicine

## 2012-05-20 ENCOUNTER — Ambulatory Visit (HOSPITAL_COMMUNITY)
Admission: RE | Admit: 2012-05-20 | Discharge: 2012-05-20 | Disposition: A | Discharge status: Home / Self Care | Payer: BC Managed Care – PPO | Source: Ambulatory Visit | Attending: Internal Medicine | Admitting: Internal Medicine

## 2012-05-20 ENCOUNTER — Encounter: Payer: Self-pay | Admitting: Internal Medicine

## 2012-05-20 ENCOUNTER — Ambulatory Visit (INDEPENDENT_AMBULATORY_CARE_PROVIDER_SITE_OTHER): Payer: BC Managed Care – PPO | Admitting: Internal Medicine

## 2012-05-20 VITALS — BP 140/90 | HR 120 | Temp 98.7°F | Ht 70.0 in | Wt 270.8 lb

## 2012-05-20 DIAGNOSIS — R05 Cough: Secondary | ICD-10-CM

## 2012-05-20 DIAGNOSIS — R059 Cough, unspecified: Secondary | ICD-10-CM | POA: Insufficient documentation

## 2012-05-20 DIAGNOSIS — R599 Enlarged lymph nodes, unspecified: Secondary | ICD-10-CM | POA: Insufficient documentation

## 2012-05-20 DIAGNOSIS — K219 Gastro-esophageal reflux disease without esophagitis: Secondary | ICD-10-CM | POA: Insufficient documentation

## 2012-05-20 DIAGNOSIS — D869 Sarcoidosis, unspecified: Secondary | ICD-10-CM

## 2012-05-20 DIAGNOSIS — Z79899 Other long term (current) drug therapy: Secondary | ICD-10-CM | POA: Insufficient documentation

## 2012-05-20 DIAGNOSIS — R093 Abnormal sputum: Secondary | ICD-10-CM | POA: Insufficient documentation

## 2012-05-20 DIAGNOSIS — J3489 Other specified disorders of nose and nasal sinuses: Secondary | ICD-10-CM | POA: Insufficient documentation

## 2012-05-20 LAB — PULMONARY FUNCTION TEST

## 2012-05-20 MED ORDER — AMOXICILLIN-POT CLAVULANATE 875-125 MG PO TABS: 1.0000 | ORAL_TABLET | Freq: Two times a day (BID) | ORAL | Status: DC

## 2012-05-20 MED ORDER — PREDNISONE (PAK) 10 MG PO TABS: ORAL_TABLET | ORAL | Status: DC

## 2012-05-20 MED ORDER — ALBUTEROL SULFATE (5 MG/ML) 0.5% IN NEBU
2.5000 mg | INHALATION_SOLUTION | Freq: Once | RESPIRATORY_TRACT | Status: AC
  Administered 2012-05-20: 2.5 mg via RESPIRATORY_TRACT

## 2012-05-20 NOTE — Patient Instructions (Addendum)
qvar 80 Take 2 puffs first thing in am and then another 2 puffs about 12 hours later and blow out through nose   Prednisone 10 mg take  4 each am x 2 days,   2 each am x 2 days,  1 each am x2days and stop   Try prilosec 20mg   Take 30-60 min before first meal of the day and Pepcid 20 mg one bedtime until cough is completely gone for at least a week without the need for cough suppression (take oxycodone)     GERD (REFLUX)  is an extremely common cause of respiratory symptoms, many times with no significant heartburn at all.    It can be treated with medication, but also with lifestyle changes including avoidance of late meals, excessive alcohol, smoking cessation, and avoid fatty foods, chocolate, peppermint, colas, red wine, and acidic juices such as orange juice.  NO MINT OR MENTHOL PRODUCTS SO NO COUGH DROPS  USE SUGARLESS CANDY INSTEAD (jolley ranchers or Stover's)  NO OIL BASED VITAMINS - use powdered substitutes.   If not improved after two weeks call [facility] and she will schedule sinus CT   .Please schedule a follow up visit in 3 months but call sooner if needed

## 2012-05-20 NOTE — Progress Notes (Signed)
  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 sarcoid  02/21/2012 1st pulmonary eval cc persistent cough since spring of 2013 ? Better on inhalers (qvar and saba) never rx prednisone dry hacking every other day worse in humid weather worse day than night - 75% better p qvar but using 2-3 x daily. rec  qvar 80 Take 2 puffs first thing in am and then another 2 puffs about 12 hours later.   Prednisone 10 mg take  4 each am x 2 days,   2 each am x 2 days,  1 each am x2days and stop. Try prilosec 20mg   Take 30-60 min before first meal of the day and Pepcid 20 mg one bedtime until cough is completely gone > did not do GERD    05/20/2012 f/u ov/Patrick Hoffman cc coughing x 2 weeks esp in am with green mucus assoc with nasal congestion   Not really sob unless coughing. No rash, ocular or articular complaints or night sweats/ wt loss.  Sleeping ok without nocturnal  or early am exacerbation  of respiratory  c/o's or need for noct saba. Also denies any obvious fluctuation of symptoms with weather or environmental changes or other aggravating or alleviating factors except as outlined above   ROS  The following are not active complaints unless bolded sore throat, dysphagia, dental problems, itching, sneezing,  nasal congestion or excess/ purulent secretions, ear ache,   fever, chills, sweats, unintended wt loss, pleuritic or exertional cp, hemoptysis,  orthopnea pnd or leg swelling, presyncope, palpitations, heartburn, abdominal pain, anorexia, nausea, vomiting, diarrhea  or change in bowel or urinary habits, change in stools or urine, dysuria,hematuria,  rash, arthralgias, visual complaints, headache, numbness weakness or  ataxia or problems with walking or coordination,  change in mood/affect or memory.             Objective:   Physical Exam   Obese amb wm nad Wt 270 05/20/2012  Wt Readings from Last 3 Encounters:  02/21/12 271 lb 12.8 oz (123.288 kg)  01/31/12 274 lb 1.6 oz (124.331 kg)  01/23/12 266 lb (120.657 kg)    HEENT: nl dentition, turbinates, and orophanx. Nl external ear canals without cough reflex   NECK :  without JVD/Nodes/TM/ nl carotid upstrokes bilaterally   LUNGS: no acc muscle use, clear to A and P bilaterally without cough on insp or exp maneuvers   CV:  RRR  no s3 or murmur or increase in P2, no edema   ABD:  soft and nontender with nl excursion in the supine position. No bruits or organomegaly, bowel sounds nl  MS:  warm without deformities, calf tenderness, cyanosis or clubbing  SKIN: warm and dry without lesions       cxr 02/05/12 Classic sarcoid changes       Assessment & Plan:

## 2012-05-21 NOTE — Assessment & Plan Note (Signed)
-   Symptom onset Spring 2013 predominantly cough   - Mediastinal bx POS NCG  02/05/12   - PFT's 05/20/2012 wnl   With nl pft's and other causes for cough likely I don't believe any treatment for sarcoid is needed at this time

## 2012-05-21 NOTE — Assessment & Plan Note (Signed)
Classic Upper airway cough syndrome, so named because it's frequently impossible to sort out how much is  CR/sinusitis with freq throat clearing (which can be related to primary GERD)   vs  causing  secondary (" extra esophageal")  GERD from wide swings in gastric pressure that occur with throat clearing, often  promoting self use of mint and menthol lozenges that reduce the lower esophageal sphincter tone and exacerbate the problem further in a cyclical fashion.   These are the same pts (now being labeled as having "irritable larynx syndrome" by some cough centers) who not infrequently have a history of having failed to tolerate ace inhibitors,  dry powder inhalers or biphosphonates or report having atypical reflux symptoms that don't respond to standard doses of PPI , and are easily confused as having aecopd or asthma flares by even experienced allergists/ pulmonologists.   His green sputum suggests sinustis so will rx this plus cyclical cough with short course prednisone and augmentin and re-emphasize the role of gerd in perpetuating cough of any cause. Next step is sinus ct

## 2012-08-21 ENCOUNTER — Telehealth: Payer: Self-pay | Admitting: Internal Medicine

## 2012-08-21 NOTE — Telephone Encounter (Signed)
Called pt x's 3 to make next ov per recall.  Pt never returned calls.  Mailed recall letter 08/21/12. Emily E McAlister °

## 2012-08-31 ENCOUNTER — Emergency Department (HOSPITAL_BASED_OUTPATIENT_CLINIC_OR_DEPARTMENT_OTHER)
Admission: EM | Admit: 2012-08-31 | Discharge: 2012-08-31 | Disposition: A | Discharge status: Home / Self Care | Payer: Worker's Compensation | Attending: Emergency Medicine | Admitting: Emergency Medicine

## 2012-08-31 ENCOUNTER — Encounter (HOSPITAL_BASED_OUTPATIENT_CLINIC_OR_DEPARTMENT_OTHER): Payer: Self-pay | Admitting: Emergency Medicine

## 2012-08-31 DIAGNOSIS — M109 Gout, unspecified: Secondary | ICD-10-CM | POA: Insufficient documentation

## 2012-08-31 DIAGNOSIS — Z8781 Personal history of (healed) traumatic fracture: Secondary | ICD-10-CM | POA: Insufficient documentation

## 2012-08-31 DIAGNOSIS — T25221A Burn of second degree of right foot, initial encounter: Secondary | ICD-10-CM

## 2012-08-31 DIAGNOSIS — Z862 Personal history of diseases of the blood and blood-forming organs and certain disorders involving the immune mechanism: Secondary | ICD-10-CM | POA: Insufficient documentation

## 2012-08-31 DIAGNOSIS — IMO0002 Reserved for concepts with insufficient information to code with codable children: Secondary | ICD-10-CM | POA: Insufficient documentation

## 2012-08-31 DIAGNOSIS — E669 Obesity, unspecified: Secondary | ICD-10-CM | POA: Insufficient documentation

## 2012-08-31 DIAGNOSIS — Z8709 Personal history of other diseases of the respiratory system: Secondary | ICD-10-CM | POA: Insufficient documentation

## 2012-08-31 DIAGNOSIS — Z8739 Personal history of other diseases of the musculoskeletal system and connective tissue: Secondary | ICD-10-CM | POA: Insufficient documentation

## 2012-08-31 DIAGNOSIS — Z8659 Personal history of other mental and behavioral disorders: Secondary | ICD-10-CM | POA: Insufficient documentation

## 2012-08-31 DIAGNOSIS — Z87891 Personal history of nicotine dependence: Secondary | ICD-10-CM | POA: Insufficient documentation

## 2012-08-31 DIAGNOSIS — Y9289 Other specified places as the place of occurrence of the external cause: Secondary | ICD-10-CM | POA: Insufficient documentation

## 2012-08-31 DIAGNOSIS — T25229A Burn of second degree of unspecified foot, initial encounter: Secondary | ICD-10-CM | POA: Insufficient documentation

## 2012-08-31 DIAGNOSIS — Z79899 Other long term (current) drug therapy: Secondary | ICD-10-CM | POA: Insufficient documentation

## 2012-08-31 DIAGNOSIS — Y9389 Activity, other specified: Secondary | ICD-10-CM | POA: Insufficient documentation

## 2012-08-31 DIAGNOSIS — Z87442 Personal history of urinary calculi: Secondary | ICD-10-CM | POA: Insufficient documentation

## 2012-08-31 DIAGNOSIS — X118XXA Contact with other hot tap-water, initial encounter: Secondary | ICD-10-CM | POA: Insufficient documentation

## 2012-08-31 DIAGNOSIS — I10 Essential (primary) hypertension: Secondary | ICD-10-CM | POA: Insufficient documentation

## 2012-08-31 DIAGNOSIS — G43909 Migraine, unspecified, not intractable, without status migrainosus: Secondary | ICD-10-CM | POA: Insufficient documentation

## 2012-08-31 DIAGNOSIS — Z8639 Personal history of other endocrine, nutritional and metabolic disease: Secondary | ICD-10-CM | POA: Insufficient documentation

## 2012-08-31 DIAGNOSIS — Z8711 Personal history of peptic ulcer disease: Secondary | ICD-10-CM | POA: Insufficient documentation

## 2012-08-31 DIAGNOSIS — T310 Burns involving less than 10% of body surface: Secondary | ICD-10-CM | POA: Insufficient documentation

## 2012-08-31 DIAGNOSIS — J45909 Unspecified asthma, uncomplicated: Secondary | ICD-10-CM | POA: Insufficient documentation

## 2012-08-31 DIAGNOSIS — Y990 Civilian activity done for income or pay: Secondary | ICD-10-CM | POA: Insufficient documentation

## 2012-08-31 DIAGNOSIS — Z8619 Personal history of other infectious and parasitic diseases: Secondary | ICD-10-CM | POA: Insufficient documentation

## 2012-08-31 HISTORY — DX: Gilbert syndrome: E80.4

## 2012-08-31 HISTORY — DX: Sarcoidosis of lung: D86.0

## 2012-08-31 HISTORY — DX: Generalized enlarged lymph nodes: R59.1

## 2012-08-31 MED ORDER — HYDROCODONE-ACETAMINOPHEN 5-325 MG PO TABS
1.0000 | ORAL_TABLET | Freq: Once | ORAL | Status: AC
  Administered 2012-08-31: 1 via ORAL
  Filled 2012-08-31: qty 1

## 2012-08-31 MED ORDER — HYDROCODONE-ACETAMINOPHEN 5-325 MG PO TABS: 1.0000 | ORAL_TABLET | Freq: Four times a day (QID) | ORAL | Status: DC | PRN

## 2012-08-31 MED ORDER — SILVER SULFADIAZINE 1 % EX CREA: TOPICAL_CREAM | Freq: Once | CUTANEOUS | Status: DC

## 2012-08-31 MED ORDER — SILVER SULFADIAZINE 1 % EX CREA
TOPICAL_CREAM | CUTANEOUS | Status: AC
  Filled 2012-08-31: qty 85
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
## 2012-09-25 ENCOUNTER — Ambulatory Visit (INDEPENDENT_AMBULATORY_CARE_PROVIDER_SITE_OTHER): Payer: BC Managed Care – PPO | Admitting: Internal Medicine

## 2012-09-25 ENCOUNTER — Encounter: Payer: Self-pay | Admitting: Internal Medicine

## 2012-09-25 VITALS — BP 130/80 | HR 113 | Temp 98.7°F | Ht 68.25 in | Wt 280.0 lb

## 2012-09-25 DIAGNOSIS — D869 Sarcoidosis, unspecified: Secondary | ICD-10-CM

## 2012-09-25 NOTE — Patient Instructions (Addendum)
No change qvar 80 Take 2 puffs first thing in am and then another 2 puffs about 12 hours later.   Only use your albuterol (proaire) as a rescue medication to be used if you can't catch your breath by resting or doing a relaxed purse lip breathing pattern. The less you use it, the better it will work when you need it.  Don't leave home without it!    Please schedule a follow up visit in 3 months but call sooner if needed cxr

## 2012-09-25 NOTE — Progress Notes (Signed)
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 sarcoid  02/21/2012 1st pulmonary eval cc persistent cough since spring of 2013 ? Better on inhalers (qvar and saba) never rx prednisone dry hacking every other day worse in humid weather worse day than night - 75% better p qvar but using 2-3 x daily. rec  qvar 80 Take 2 puffs first thing in am and then another 2 puffs about 12 hours later.   Prednisone 10 mg take  4 each am x 2 days,   2 each am x 2 days,  1 each am x2days and stop. Try prilosec 20mg   Take 30-60 min before first meal of the day and Pepcid 20 mg one bedtime until cough is completely gone > did not do GERD diet    05/20/2012 f/u ov/Patrick Hoffman cc coughing x 2 weeks esp in am with green mucus assoc with nasal congestion  rec qvar 80 Take 2 puffs first thing in am and then another 2 puffs about 12 hours later and blow out through nose  Prednisone 10 mg take  4 each am x 2 days,   2 each am x 2 days,  1 each am x2days and stop  Try prilosec 20mg   Take 30-60 min before first meal of the day and Pepcid 20 mg one bedtime until cough is completely gone for at least a week without the need for cough suppression (take oxycodone)  GERD  Diet  If not improved after two weeks call [facility] [phone] and she will schedule sinus CT > not done  09/25/2012 f/u ov/Patrick Hoffman re sarcoid on qvar 80 2 bid  Chief Complaint  Patient presents with  . Follow-up    Pt states cough is much better and denies any new co's today.   only problem yardwork in heat and not using saba at all   Not really sob unless coughing. No rash, ocular or articular complaints or night sweats/ wt loss.  Sleeping ok without  nocturnal  or early am exacerbation  of respiratory  c/o's or need for noct saba. Also denies any obvious fluctuation of symptoms with weather or environmental changes or other aggravating or alleviating factors except as outlined above   Current Medications, Allergies, Past Medical History, Past Surgical History, Family History, and Social History were reviewed in [employer] record.  ROS  The following are not active complaints unless bolded sore throat, dysphagia, dental problems, itching, sneezing,  nasal congestion or excess/ purulent secretions, ear ache,   fever, chills, sweats, unintended wt loss, pleuritic or exertional cp, hemoptysis,  orthopnea pnd or leg swelling, presyncope, palpitations, heartburn, abdominal pain, anorexia, nausea, vomiting, diarrhea  or change in bowel or urinary habits, change in stools or urine, dysuria,hematuria,  rash, arthralgias, visual complaints, headache, numbness weakness or ataxia or problems with walking or coordination,  change in mood/affect or memory.                 Objective:   Physical Exam   Obese amb wm nad Wt 270 05/20/2012  > 09/25/2012  280  Wt Readings from Last 3 Encounters:  02/21/12 271 lb 12.8 oz (  123.288 kg)  01/31/12 274 lb 1.6 oz (124.331 kg)  01/23/12 266 lb (120.657 kg)    HEENT: nl dentition, turbinates, and orophanx. Nl external ear canals without cough reflex   NECK :  without JVD/Nodes/TM/ nl carotid upstrokes bilaterally   LUNGS: no acc muscle use, clear to A and P bilaterally without cough on insp or exp maneuvers   CV:  RRR  no s3 or murmur or increase in P2, no edema   ABD:  soft and nontender with nl excursion in the supine position. No bruits or organomegaly, bowel sounds nl  MS:  warm without deformities, calf tenderness, cyanosis or clubbing  SKIN: warm and dry without lesions       cxr 02/05/12 Classic sarcoid changes       Assessment & Plan:

## 2012-09-27 NOTE — Assessment & Plan Note (Signed)
-   Symptom onset Spring 2013 predominantly cough   - Mediastinal bx POS NCG  02/05/12   - PFT's 05/20/2012 wnl   Doing well with cough control using ICS with no evidence of significant parenchymal lung dz progression so no change in rx needed    Each maintenance medication was reviewed in detail including most importantly the difference between maintenance and as needed and under what circumstances the prns are to be used.  Please see instructions for details which were reviewed in writing and the patient given a copy.

## 2012-12-18 ENCOUNTER — Telehealth: Payer: Self-pay | Admitting: Internal Medicine

## 2012-12-18 NOTE — Telephone Encounter (Signed)
left messages for pt to call back to schedule follow up apt. No return calls back. Sent letter 12/18/12 ° °

## 2013-02-24 ENCOUNTER — Ambulatory Visit (INDEPENDENT_AMBULATORY_CARE_PROVIDER_SITE_OTHER)
Admission: RE | Admit: 2013-02-24 | Discharge: 2013-02-24 | Disposition: A | Discharge status: Home / Self Care | Payer: BC Managed Care – PPO | Source: Ambulatory Visit | Attending: Internal Medicine | Admitting: Internal Medicine

## 2013-02-24 ENCOUNTER — Ambulatory Visit (INDEPENDENT_AMBULATORY_CARE_PROVIDER_SITE_OTHER): Payer: BC Managed Care – PPO | Admitting: Internal Medicine

## 2013-02-24 ENCOUNTER — Encounter: Payer: Self-pay | Admitting: Internal Medicine

## 2013-02-24 VITALS — BP 142/90 | HR 102 | Temp 98.5°F | Ht 68.0 in | Wt 278.0 lb

## 2013-02-24 DIAGNOSIS — D869 Sarcoidosis, unspecified: Secondary | ICD-10-CM

## 2013-02-24 DIAGNOSIS — R05 Cough: Secondary | ICD-10-CM

## 2013-02-24 DIAGNOSIS — R059 Cough, unspecified: Secondary | ICD-10-CM

## 2013-02-24 MED ORDER — TRAMADOL HCL 50 MG PO TABS: ORAL_TABLET | ORAL | Status: DC

## 2013-02-24 MED ORDER — PREDNISONE (PAK) 10 MG PO TABS: ORAL_TABLET | ORAL | Status: DC

## 2013-02-24 MED ORDER — AMOXICILLIN-POT CLAVULANATE 875-125 MG PO TABS: 1.0000 | ORAL_TABLET | Freq: Two times a day (BID) | ORAL | Status: DC

## 2013-02-24 NOTE — Progress Notes (Signed)
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 Sarcoid.    History of Present Illness   02/21/2012 1st pulmonary eval cc persistent cough since spring of 2013 ? Better on inhalers (qvar and saba) never rx prednisone dry hacking every other day worse in humid weather worse day than night - 75% better p qvar but using 2-3 x daily. rec  qvar 80 Take 2 puffs first thing in am and then another 2 puffs about 12 hours later.   Prednisone 10 mg take  4 each am x 2 days,   2 each am x 2 days,  1 each am x2days and stop. Try prilosec 20mg   Take 30-60 min before first meal of the day and Pepcid 20 mg one bedtime until cough is completely gone > did not do GERD diet    05/20/2012 f/u ov/Patrick Hoffman cc coughing x 2 weeks esp in am with green mucus assoc with nasal congestion  rec qvar 80 Take 2 puffs first thing in am and then another 2 puffs about 12 hours later and blow out through nose  Prednisone 10 mg take  4 each am x 2 days,   2 each am x 2 days,  1 each am x2days and stop  Try prilosec 20mg   Take 30-60 min before first meal of the day and Pepcid 20 mg one bedtime until cough is completely gone for at least a week without the need for cough suppression (take oxycodone)  GERD  Diet  If not improved after two weeks call Libby [phone] and she will schedule sinus CT > not done  09/25/2012 f/u ov/Patrick Hoffman re sarcoid on qvar 80 2 bid  Chief Complaint  Patient presents with  . Follow-up    Pt states cough is much better and denies any new co's today.   only problem yardwork in heat and not using saba at all rec No change qvar 80 Take 2 puffs first thing in am and then another 2 puffs about 12 hours  later.  Only use your albuterol (proaire) as rescue   02/24/2013 f/u ov/Patrick Hoffman re: sarcoid  Chief Complaint  Patient presents with  . Follow-up    [name] prod cough and increased wheezing x 2.5 wks. Cough is prod with minimal green sputum.  He has not used rescue inhaler in over 3 wks.    Not really sob unless coughing. No rash, ocular or articular complaints or night sweats/ wt loss.  Sleeping ok without nocturnal  or early am exacerbation  of respiratory  c/o's or need for noct saba. Also denies any obvious fluctuation of symptoms with weather or environmental changes or other aggravating or alleviating factors except as outlined above   Current Medications, Allergies, Past Medical History, Past Surgical History, Family History, and Social History were reviewed in [employer] record.  ROS  The following are not active complaints unless bolded sore throat, dysphagia, dental problems, itching, sneezing,  nasal congestion or excess/ purulent secretions, ear ache,   fever, chills, sweats, unintended wt loss, pleuritic or exertional cp, hemoptysis,  orthopnea pnd or leg swelling, presyncope, palpitations, heartburn, abdominal pain, anorexia, nausea,  vomiting, diarrhea  or change in bowel or urinary habits, change in stools or urine, dysuria,hematuria,  rash, arthralgias, visual complaints, headache, numbness weakness or ataxia or problems with walking or coordination,  change in mood/affect or memory.                 Objective:   Physical Exam   Obese amb wm nad Wt 270 05/20/2012  > 09/25/2012  280 > 02/24/13 278 Wt Readings from Last 3 Encounters:  02/21/12 271 lb 12.8 oz (123.288 kg)  01/31/12 274 lb 1.6 oz (124.331 kg)  01/23/12 266 lb (120.657 kg)    HEENT: nl dentition, turbinates, and orophanx. Nl external ear canals without cough reflex   NECK :  without JVD/Nodes/TM/ nl carotid upstrokes bilaterally   LUNGS: no acc muscle use, clear to A and P  bilaterally without cough on insp or exp maneuvers   CV:  RRR  no s3 or murmur or increase in P2, no edema   ABD:  soft and nontender with nl excursion in the supine position. No bruits or organomegaly, bowel sounds nl  MS:  warm without deformities, calf tenderness, cyanosis or clubbing  SKIN: warm and dry without lesions       CXR  02/24/2013 :    No acute cardiopulmonary disease.  Stable appearance from the prior study.  Persistent prominent hila likely from mild bilateral hilar adenopathy.     Assessment & Plan:

## 2013-02-24 NOTE — Patient Instructions (Addendum)
Prednisone 10 mg take  4 each am x 2 days,   2 each am x 2 days,  1 each am x 2 days and stop   Augmentin 875 mg take one pill twice daily  X 10 days - take at breakfast and supper with large glass of water.  It would help reduce the usual side effects (diarrhea and yeast infections) if you ate cultured yogurt at lunch.   Try prilosec 20mg   Take 30-60 min before first meal of the day and Pepcid 20 mg one bedtime until cough is completely gone for at least a week without the need for cough suppression  Take delsym two tsp every 12 hours and supplement if needed with  tramadol 50 mg up to 2 every 4 hours to suppress the urge to cough. Swallowing water or using ice chips/non mint and menthol containing candies (such as lifesavers or sugarless jolly ranchers) are also effective.  You should rest your voice and avoid activities that you know make you cough.  Once you have eliminated the cough for 3 straight days try reducing the tramadol first,  then the delsym as tolerated.     Please schedule a follow up visit in 6 months but call sooner if needed  Late add  sinus ct if not better (in tickle file for call back in 2 weeks)

## 2013-02-26 NOTE — Assessment & Plan Note (Signed)
-   Symptom onset Spring 2013 predominantly cough   - Mediastinal bx POS NCG  02/05/12   - PFT's 05/20/2012 wnl   No evidence of sign systemic activity ? Airways involvement vs upper airway cough syndrom (see cough a/p). No need to change rx with topical steroids only in the form of qvar

## 2013-02-26 NOTE — Assessment & Plan Note (Signed)
Most likely  Classic Upper airway cough syndrome, so named because it's frequently impossible to sort out how much is  CR/sinusitis with freq throat clearing (which can be related to primary GERD)   vs  causing  secondary (" extra esophageal")  GERD from wide swings in gastric pressure that occur with throat clearing, often  promoting self use of mint and menthol lozenges that reduce the lower esophageal sphincter tone and exacerbate the problem further in a cyclical fashion.   These are the same pts (now being labeled as having "irritable larynx syndrome" by some cough centers) who not infrequently have a history of having failed to tolerate ace inhibitors,  dry powder inhalers or biphosphonates or report having atypical reflux symptoms that don't respond to standard doses of PPI , and are easily confused as having aecopd or asthma flares by even experienced allergists/ pulmonologists.  Try max gerd, rx with augmentin then sinus ct if not better

## 2013-03-06 ENCOUNTER — Encounter: Payer: Self-pay | Admitting: Internal Medicine

## 2013-03-06 ENCOUNTER — Telehealth: Payer: Self-pay | Admitting: *Deleted

## 2013-03-06 NOTE — Telephone Encounter (Signed)
Message copied by Christen Butter on Thu Mar 06, 2013 11:03 AM ------      Message from: Sandrea Hughs B      Created: Wed Feb 26, 2013  5:59 AM        Call him and order sinus ct if cough not better  ------

## 2013-03-06 NOTE — Telephone Encounter (Signed)
Spoke with the pt  He states that his cough is much better He reports cough is about 90% resolved He will call if needed  Will forward to MW so that he is aware

## 2013-03-26 ENCOUNTER — Encounter: Payer: Self-pay | Admitting: Neurology

## 2013-05-09 ENCOUNTER — Encounter (INDEPENDENT_AMBULATORY_CARE_PROVIDER_SITE_OTHER): Payer: Self-pay

## 2013-05-09 ENCOUNTER — Encounter: Payer: Self-pay | Admitting: Neurology

## 2013-05-09 ENCOUNTER — Ambulatory Visit (INDEPENDENT_AMBULATORY_CARE_PROVIDER_SITE_OTHER): Payer: BC Managed Care – PPO | Admitting: Neurology

## 2013-05-09 VITALS — BP 140/89 | HR 95 | Resp 18 | Ht 69.0 in | Wt 279.0 lb

## 2013-05-09 DIAGNOSIS — G4710 Hypersomnia, unspecified: Secondary | ICD-10-CM

## 2013-05-09 DIAGNOSIS — G4730 Sleep apnea, unspecified: Secondary | ICD-10-CM

## 2013-05-09 DIAGNOSIS — G4726 Circadian rhythm sleep disorder, shift work type: Secondary | ICD-10-CM

## 2013-05-09 MED ORDER — ZALEPLON 10 MG PO CAPS: 10.0000 mg | ORAL_CAPSULE | Freq: Every evening | ORAL | Status: DC | PRN

## 2013-05-09 NOTE — Progress Notes (Signed)
[facility]  Provider:  Larey Seat, M D  Referring Provider: Jerlyn Ly, MD Primary Care Physician:  Jerlyn Ly, MD  Chief Complaint  Patient presents with  . snoring and obesity    NP, paper referral, Rm 11    HPI:  Patrick Hoffman is a 53 y.o. male  Is seen here as a referral/ revisit  from Dr. Joylene Draft for  a sleep  medicine evaluation.    Patrick Hoffman, married, Caucasian right-handed gentleman is referred for evaluation of possible sleep apnea. Over the last 2 years he has gained about 70 pounds and he was diagnosed with sarcoidosis of the lungs. This was confirmed by a biopsy on lymph nodes in October 2013. He states that no matter how much he sleeps he never feels restored or refreshed. His wife has witnessed apneas and loud snoring. His wife believes that he had for many years sleep apnea before she became aware of what troubles him. However the treatment with steroids has certainly accelerated his weight gain.  Patrick Hoffman reports that his work schedule has influenced his sleep pattern significantly. 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 The first 18 years he worked a.m. 2 PM regular [employer] but over the last 2 years he has begun starting his shift at 11 PM and working into the morning hours. He often is not able to get sleep even daytime after that and so he has developed a commulative sleep deficit.  He will wake hiself up when on the back, choking , snorting ,gasping for air.  The patient is off on Friday and Saturday nights. He mitigates a couple of hours of sleep and a rolled once he has fallen asleep he states he normally can sleep 4 hours on work days but after 12 hours on weekends. He feels as if he crashes, he will get migraines with visual aura, and sometimes hemi numbness, , the worst type every 2 month. He rarely drinks caffeine he reports his nor other stimulants he uses. He is on 2 pulmonary inhalers,  albuterol sulfate and a rescue inhaler for her bronchitis and exacerbated. He also reported that just of the last 2 years he developed allergic reactions with airway allergies and  Puffiness. He is frequently noted lymph node swelling, and based on his work and sleep schedule he also doesn't have the energy to do the same things he used to.  He was use to be an out of doors person but fish and hunt it and now he feels that he doesn't have any time or energy to do so. He feels physically very deconditioned.   He grew up with 2 smoking parents , was exposed to at least 20 years of living with 2 smokers.   Review of Systems: Out of a complete 14 system review, the patient complains of only the following symptoms, and all other reviewed systems are negative. The patient and/or his cough, wheezing, snoring, allergic rhinitis, skin reactions, redeye followed to much sleep as well as feeling that there's not enough sleep decreased energy, headache shift work weight gain and was of fatigue severity scale at 44 points and the Epworth Sleepiness Scale was not endorsed.  History   Social History  . Marital Status: Married    Spouse Name: susan    Number of Children: 1  . Years of Education: 12   Occupational History  . [employer]  Social History Main Topics  . Smoking status: Former Smoker -- 1.50 packs/day for 20 years    Types: Cigarettes    Quit date: 04/17/2009  . Smokeless tobacco: Never Used  . Alcohol Use: Yes     Comment: rare  . Drug Use: No  . Sexual Activity: Not on file   Other Topics Concern  . Not on file   Social History Narrative   Patient is married [name]).   Patient has one daughter.   Hunt and fish, watch grandchild    Family History  Problem Relation Age of Onset  . COPD Mother   . COPD Father   . Hypertension Mother   . Hypertension Father   . Hypertension Brother   . Breast cancer Maternal Grandmother   . Heart  disease Paternal Grandfather     Past Medical History  Diagnosis Date  . Shingles     on face, age 7yo and again at 53yo   . Gout   . Nephrolithiasis   . Allergic rhinitis   . Hypertension   . Anxiety   . History of stomach ulcers   . Obesity   . H/O asbestos exposure   . Bronchitis   . Asthma   . Migraine   . OA (osteoarthritis) of knee     bilat  . Broken shoulder     right shoulder approx 10 years ago  . Gilbert's syndrome   . Sarcoidosis of lung   . Lymphadenopathy     Past Surgical History  Procedure Laterality Date  . Kidney stones    . Testicle surgery    . Tonsillectomy    . Mediastinoscopy  02/05/2012    Procedure: MEDIASTINOSCOPY;  Surgeon: Melrose Nakayama, MD;  Location: [place];  Service: Thoracic;  Laterality: N/A;    Current Outpatient Prescriptions  Medication Sig Dispense Refill  . allopurinol (ZYLOPRIM) 300 MG tablet Take 300 mg by mouth daily.       Marland Kitchen amLODipine (NORVASC) 5 MG tablet Take 5 mg by mouth daily.       Marland Kitchen amoxicillin-clavulanate (AUGMENTIN) 875-125 MG per tablet Take 1 tablet by mouth 2 (two) times daily.  20 tablet  0  . fenofibrate 160 MG tablet Take 160 mg by mouth daily.       Marland Kitchen losartan (COZAAR) 100 MG tablet Take 100 mg by mouth daily.       Marland Kitchen PROAIR HFA 108 (90 BASE) MCG/ACT inhaler Inhale 2 puffs into the lungs every 6 (six) hours as needed. Shortness of breath      . QVAR 80 MCG/ACT inhaler Inhale 2 puffs into the lungs 2 (two) times daily.        No current facility-administered medications for this visit.    Allergies as of 05/09/2013  . (No Known Allergies)    Vitals: BP 140/89  Pulse 95  Resp 18  Ht 5\' 9"  (1.753 m)  Wt 279 lb (126.554 kg)  BMI 41.18 kg/m2 Last Weight:  Wt Readings from Last 1 Encounters:  05/09/13 279 lb (126.554 kg)   Last Height:   Ht Readings from Last 1 Encounters:  05/09/13 5\' 9"  (1.753 m)    Physical exam:  General: The patient is awake, alert and appears not in acute distress.  The patient is well groomed. Head: Normocephalic, atraumatic. Neck is supple. Mallampati 3, neck circumference: 18 Cardiovascular:  Regular rate and rhythm , without  murmurs or carotid bruit, and without distended neck veins. Respiratory: Lungs are  clear to auscultation. Skin:  Without evidence of edema, or rash Trunk: BMI is  elevated . Barrel chested, broad shoulders, short- thick neck.   Neurologic exam : The patient is awake and alert, oriented to place and time.  Memory subjective  described as intact.  There is a normal attention span & concentration ability. Speech is fluent with dysphonia , not aphasia. Mood and affect are appropriate.  Cranial nerves: Pupils are equal and briskly reactive to light.  Funduscopic exam without evidence of pallor or edema. Extraocular movements abnormal, irregular eye movements  in vertical and horizontal planes, there is no restriction to his gaze of the eyes are bopping. His  Movement is slightly incoordinated .  Visual fields by finger perimetry are intact. Hearing to finger rub intact.  Facial sensation intact to fine touch.  Facial motor strength is symmetric and tongue and uvula move midline.  Motor exam:   It is not easy for Mr. Mcaffee to relax his muscle tone at baseline this seems to be an increased on a slide rigidity another stools muscle movement. I noticed on all sorts very fine amplitude tremor in both hands but does not appear to be parkinsonian. He has very brisk reflexes in am concerned that he may have a spinal  Injury or upper motor neuron lesion. Sensory:  Fine touch, pinprick and vibration were tested in all extremities. Proprioception is normal.  Coordination: Rapid alternating movements in the fingers/hands is tested and normal. Finger-to-nose maneuver with bilateral  tremor.  Gait and station: Patient walks without assistive device and performed a tandem gait, he has difficulties with toe walk or heel walk Strength  Appears  reduced  In hands and ankles, skin is dystrophic, but  he is not diabetic.   Stance is stable and normal.  Steps are unfragmented.   Deep tendon reflexes: in the  upper and lower extremities are symmetric and intact. Babinski maneuver response is downgoing.  Assessment:  After physical and neurologic examination, review of laboratory studies, imaging, neurophysiology testing and pre-existing records, assessment is   1)BMI , The patient has gained  exorbitant weight over the last 2 years and is now suffering  sleepiness because of his shift work Schedule and snoring, and his deconditioning.   He is excessively daytime sleepy but an Epworth sleepiness score of 9, fatigue severity score of 44 in a man , who  rarely naps.   2)He has a pulmonary disease admitted even likely that even mild apnea is present would be exacerbated by desaturations. This lead study should be a split night with carbon dioxide measures, he should be titrated gently on a nasal pillow or nasal mask. I would asked him to use a saline nasal spray just to flush his nostrils before the sleep study but he doesn't feel suffocating. He can bring his medications as needed  To the sleep lab at night. He is used to sleep with the TV on , as he sleeps in daytime and likes a distracting sound. i asked him to bring his sleep aid to the sleep lab in case he needs it.   3) his muscle tone is abnormal. He has brisk reflexes and shiny skin on the lower extremities, rigidity over the shoulders and arms, weaker grip strength. Will need to further evaluate possible myopathy next visit.        Plan:  Treatment plan and additional workup : SPLIT as outlined above.

## 2013-05-09 NOTE — Patient Instructions (Signed)
Polysomnography (Sleep Studies) Polysomnography (PSG) is a series of tests used for detecting (diagnosing) obstructive sleep apnea and other sleep disorders. The tests measure how some parts of your body are working while you are sleeping. The tests are extensive and expensive. They are done in a sleep lab or hospital, and vary from center to center. Your caregiver may perform other more simple sleep studies and questionnaires before doing more complete and involved testing. Testing may not be covered by insurance. Some of these tests are:  An EEG (Electroencephalogram). This tests your brain waves and stages of sleep.  An EOG (Electrooculogram). This measures the movements of your eyes. It detects periods of REM (rapid eye movement) sleep, which is your dream sleep.  An EKG (Electrocardiogram). This measures your heart rhythm.  EMG (Electromyography). This is a measurement of how the muscles are working in your upper airway and your legs while sleeping.  An oximetry measurement. It measures how much oxygen (air) you are getting while sleeping.  Breathing efforts may be measured. The same test can be interpreted (understood) differently by different caregivers and centers that study sleep.  Studies may be given an apnea/hypopnea index (AHI). This is a number which is found by counting the times of no breathing or under breathing during the night, and relating those numbers to the amount of time spent in bed. When the AHI is greater than 15, the patient is likely to complain of daytime sleepiness. When the AHI is greater than 30, the patient is at increased risk for heart problems and must be followed more closely. Following the AHI also allows you to know how treatment is working. Simple oximetry (tracking the amount of oxygen that is taken in) can be used for screening patients who:  Do not have symptoms (problems) of OSA.  Have a normal Epworth Sleepiness Scale Score.  Have a low pre-test  probability of having OSA.  Have none of the upper airway problems likely to cause apnea.  Oximetry is also used to determine if treatment is effective in patients who showed significant desaturations (not getting enough oxygen) on their home sleep study. One extra measure of safety is to perform additional studies for the person who only snores. This is because no one can predict with absolute certainty who will have OSA. Those who show significant desaturations (not getting enough oxygen) are recommended to have a more detailed sleep study. Document Released: 10/08/2002 Document Revised: 06/26/2011 Document Reviewed: 04/03/2005 [facility] Patient Information 2014 [place]. Calorie Counting Diet A calorie counting diet requires you to eat the number of calories that are right for you in a day. Calories are the measurement of how much energy you get from the food you eat. Eating the right amount of calories is important for staying at a healthy weight. If you eat too many calories, your body will store them as fat and you may gain weight. If you eat too few calories, you may lose weight. Counting the number of calories you eat during a day will help you know if you are eating the right amount. A Registered Dietitian can determine how many calories you need in a day. The amount of calories needed varies from person to person. If your goal is to lose weight, you will need to eat fewer calories. Losing weight can benefit you if you are overweight or have health problems such as heart disease, high blood pressure, or diabetes. If your goal is to gain weight, you will need to  eat more calories. Gaining weight may be necessary if you have a certain health problem that causes your body to need more energy. TIPS Whether you are increasing or decreasing the number of calories you eat during a day, it may be hard to get used to changes in what you eat and drink. The following are tips to help you keep track  of the number of calories you eat.  Measure foods at home with measuring cups. This helps you know the amount of food and number of calories you are eating.  Restaurants often serve food in amounts that are larger than 1 serving. While eating out, estimate how many servings of a food you are given. For example, a serving of cooked rice is  cup or about the size of half of a fist. Knowing serving sizes will help you be aware of how much food you are eating at restaurants.  Ask for smaller portion sizes or child-size portions at restaurants.  Plan to eat half of a meal at a restaurant. Take the rest home or share the other half with a friend.  Read the Nutrition Facts panel on food labels for calorie content and serving size. You can find out how many servings are in a package, the size of a serving, and the number of calories each serving has.  For example, a package might contain 3 cookies. The Nutrition Facts panel on that package says that 1 serving is 1 cookie. Below that, it will say there are 3 servings in the container. The calories section of the Nutrition Facts label says there are 90 calories. This means there are 90 calories in 1 cookie (1 serving). If you eat 1 cookie you have eaten 90 calories. If you eat all 3 cookies, you have eaten 270 calories (3 servings x 90 calories = 270 calories). The list below tells you how big or small some common portion sizes are.  1 oz.........4 stacked dice.  3 oz........Marland KitchenDeck of cards.  1 tsp.......Marland KitchenTip of little finger.  1 tbs......Marland KitchenMarland KitchenThumb.  2 tbs.......Marland KitchenGolf ball.   cup......Marland KitchenHalf of a fist.  1 cup.......Marland KitchenA fist. KEEP A FOOD LOG Write down every food item you eat, the amount you eat, and the number of calories in each food you eat during the day. At the end of the day, you can add up the total number of calories you have eaten. It may help to keep a list like the one below. Find out the calorie information by reading the Nutrition Facts  panel on food labels. Breakfast  Bran cereal (1 cup, 110 calories).  Fat-free milk ( cup, 45 calories). Snack  Apple (1 medium, 80 calories). Lunch  Spinach (1 cup, 20 calories).  Tomato ( medium, 20 calories).  Chicken breast strips (3 oz, 165 calories).  Shredded cheddar cheese ( cup, 110 calories).  Light [place] dressing (2 tbs, 60 calories).  Whole-wheat bread (1 slice, 80 calories).  Tub margarine (1 tsp, 35 calories).  Vegetable soup (1 cup, 160 calories). Dinner  Pork chop (3 oz, 190 calories).  Brown rice (1 cup, 215 calories).  Steamed broccoli ( cup, 20 calories).  Strawberries (1  cup, 65 calories).  Whipped cream (1 tbs, 50 calories). Daily Calorie Total: 5374 Document Released: 04/03/2005 Document Revised: 06/26/2011 Document Reviewed: 09/28/2006 [facility] Patient Information 2014 [place].

## 2013-12-04 ENCOUNTER — Ambulatory Visit (INDEPENDENT_AMBULATORY_CARE_PROVIDER_SITE_OTHER): Payer: BC Managed Care – PPO | Admitting: Internal Medicine

## 2013-12-04 ENCOUNTER — Encounter: Payer: Self-pay | Admitting: Internal Medicine

## 2013-12-04 ENCOUNTER — Ambulatory Visit (INDEPENDENT_AMBULATORY_CARE_PROVIDER_SITE_OTHER)
Admission: RE | Admit: 2013-12-04 | Discharge: 2013-12-04 | Disposition: A | Discharge status: Home / Self Care | Payer: BC Managed Care – PPO | Source: Ambulatory Visit | Attending: Internal Medicine | Admitting: Internal Medicine

## 2013-12-04 VITALS — BP 122/82 | HR 92 | Ht 69.0 in | Wt 254.0 lb

## 2013-12-04 DIAGNOSIS — D869 Sarcoidosis, unspecified: Secondary | ICD-10-CM

## 2013-12-04 DIAGNOSIS — J45991 Cough variant asthma: Secondary | ICD-10-CM

## 2013-12-04 NOTE — Assessment & Plan Note (Signed)
Resolved on  Very low doses of qvar > rec he continue indefinitely and use the amt of saba he feels he needs to step up to higher doses of qvar for any flares of cough  See instructions for specific recommendations which were reviewed directly with the patient who was given a copy with highlighter outlining the key components.

## 2013-12-04 NOTE — Progress Notes (Signed)
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 Sarcoid related cough resolved on qvar     History of Present Illness  02/21/2012 1st pulmonary eval cc persistent cough since spring of 2013 ? Better on inhalers (qvar and saba) never rx prednisone dry hacking every other day worse in humid weather worse day than night - 75% better p qvar but using 2-3 x daily. rec  qvar 80 Take 2 puffs first thing in am and then another 2 puffs about 12 hours later.   Prednisone 10 mg take  4 each am x 2 days,   2 each am x 2 days,  1 each am x2days and stop. Try prilosec 20mg   Take 30-60 min before first meal of the day and Pepcid 20 mg one bedtime until cough is completely gone > did not do GERD diet    05/20/2012 f/u ov/Patrick Hoffman cc coughing x 2 weeks esp in am with green mucus assoc with nasal congestion  rec qvar 80 Take 2 puffs first thing in am and then another 2 puffs about 12 hours later and blow out through nose  Prednisone 10 mg take  4 each am x 2 days,   2 each am x 2 days,  1 each am x2days and stop  Try prilosec 20mg   Take 30-60 min before first meal of the day and Pepcid 20 mg one bedtime until cough is completely gone for at least a week without the need for cough suppression (take oxycodone)  GERD  Diet  If not improved after two weeks call Patrick Hoffman [phone] and she will schedule sinus CT > not done   09/25/2012 f/u ov/Patrick Hoffman re sarcoid on qvar 80 2 bid  Chief Complaint  Patient presents with  . Follow-up    Pt states cough is much better and denies any new co's today.   only problem yardwork in heat and not using saba at all rec No change qvar 80 Take 2 puffs first thing in am and then  another 2 puffs about 12 hours later.  Only use your albuterol (proaire) as rescue   02/24/2013 f/u ov/Patrick Hoffman re: sarcoid  Chief Complaint  Patient presents with  . Follow-up    [name] prod cough and increased wheezing x 2.5 wks. Cough is prod with minimal green sputum.  He has not used rescue inhaler in over 3 wks.   Not really sob unless coughing. No rash, ocular or articular complaints or night sweats/ wt loss. rec Prednisone 10 mg take  4 each am x 2 days,   2 each am x 2 days,  1 each am x 2 days and stop  Augmentin 875 mg take one pill twice daily  X 10 days - t  Try prilosec 20mg   Take 30-60 min before first meal of the day and Pepcid 20 mg one bedtime until cough is completely gone for at least a week without the need for cough suppression Take delsym two tsp every 12 hours and supplement if needed with  tramadol 50 mg up to 2 every 4 hours to suppress the urge to cough  Please schedule a follow up  visit in 6 months but call sooner if needed  Late add  sinus ct if not better (in tickle file for call back in 2 weeks)   12/04/2013 f/u ov/Patrick Hoffman re: qvar 80 one puff before work and no need for saba at all Chief Complaint  Patient presents with  . Follow-up    Pt states that breathing has been doing great! No complaints. Pt has lost about 24lb since 02/2013.       Not limited by breathing from desired activities    No obvious day to day or daytime variabilty or assoc chronic cough or cp or chest tightness, subjective wheeze overt sinus or hb symptoms. No unusual exp hx or h/o childhood pna/ asthma or knowledge of premature birth.  Sleeping ok without nocturnal  or early am exacerbation  of respiratory  c/o's or need for noct saba. Also denies any obvious fluctuation of symptoms with weather or environmental changes or other aggravating or alleviating factors except as outlined above   Current Medications, Allergies, Complete Past Medical History, Past Surgical History, Family History,  and Social History were reviewed in [employer] record.  ROS  The following are not active complaints unless bolded sore throat, dysphagia, dental problems, itching, sneezing,  nasal congestion or excess/ purulent secretions, ear ache,   fever, chills, sweats, unintended wt loss, pleuritic or exertional cp, hemoptysis,  orthopnea pnd or leg swelling, presyncope, palpitations, heartburn, abdominal pain, anorexia, nausea, vomiting, diarrhea  or change in bowel or urinary habits, change in stools or urine, dysuria,hematuria,  rash, arthralgias, visual complaints, headache, numbness weakness or ataxia or problems with walking or coordination,  change in mood/affect or memory.             Objective:   Physical Exam   Obese amb wm nad Wt 270 05/20/2012  > 09/25/2012  280 > 02/24/13 278 > 12/04/2013  254  Wt Readings from Last 3 Encounters:  02/21/12 271 lb 12.8 oz (123.288 kg)  01/31/12 274 lb 1.6 oz (124.331 kg)  01/23/12 266 lb (120.657 kg)    HEENT: nl dentition, turbinates, and orophanx. Nl external ear canals without cough reflex   NECK :  without JVD/Nodes/TM/ nl carotid upstrokes bilaterally   LUNGS: no acc muscle use, clear to A and P bilaterally without cough on insp or exp maneuvers   CV:  RRR  no s3 or murmur or increase in P2, no edema   ABD:  soft and nontender with nl excursion in the supine position. No bruits or organomegaly, bowel sounds nl  MS:  warm without deformities, calf tenderness, cyanosis or clubbing  SKIN: warm and dry without lesions       CXR  12/04/2013 :  Stable hilar adenopathy by plain radiography. No significant interval change.     Assessment & Plan:

## 2013-12-04 NOTE — Patient Instructions (Addendum)
Please remember to go to the   x-ray department downstairs for your tests - we will call you with the results when they are available.   If you are satisfied with your treatment plan,  let your doctor know and he/she can either refill your medications or you can return here when your prescription runs out.     If in any way you are not 100% satisfied,  please tell us.  If 100% better, tell your friends!  Pulmonary follow up is as needed        

## 2013-12-04 NOTE — Assessment & Plan Note (Addendum)
-   Symptom onset Spring 2013 predominantly cough but cxr in 09/1999 showed hilar prominence    - Mediastinal bx POS NCG  02/05/12   - PFT's 05/20/2012 wnl    - cough resolved on qvar   In restropect I doubt his symptoms of chronic cough are probably not related to his chronic med/hilar adenopathy which are c/w (but not diagnostic of )  inactive sarcoid and he does not need further w/u or f/u for this problem from my perspective

## 2014-06-08 ENCOUNTER — Encounter: Payer: Self-pay | Admitting: Internal Medicine

## 2014-06-19 ENCOUNTER — Ambulatory Visit: Payer: Self-pay | Admitting: Nurse Practitioner

## 2014-06-19 ENCOUNTER — Ambulatory Visit: Payer: Self-pay | Admitting: Physician Assistant

## 2014-08-17 ENCOUNTER — Ambulatory Visit (AMBULATORY_SURGERY_CENTER): Payer: Self-pay | Admitting: *Deleted

## 2014-08-17 VITALS — Ht 69.0 in | Wt 282.6 lb

## 2014-08-17 DIAGNOSIS — Z1211 Encounter for screening for malignant neoplasm of colon: Secondary | ICD-10-CM

## 2014-08-17 MED ORDER — NA SULFATE-K SULFATE-MG SULF 17.5-3.13-1.6 GM/177ML PO SOLN: 1.0000 | Freq: Once | ORAL | Status: DC

## 2014-08-17 NOTE — Progress Notes (Signed)
No egg or soy allergy No issues with past sedation No diet pills No home 02 emmi video to pt's wife e mail

## 2014-08-21 ENCOUNTER — Encounter: Payer: Self-pay | Admitting: Internal Medicine

## 2014-08-21 ENCOUNTER — Ambulatory Visit (AMBULATORY_SURGERY_CENTER): Payer: BLUE CROSS/BLUE SHIELD | Admitting: Internal Medicine

## 2014-08-21 ENCOUNTER — Other Ambulatory Visit: Payer: Self-pay | Admitting: Internal Medicine

## 2014-08-21 VITALS — BP 124/75 | HR 89 | Temp 99.0°F | Resp 16 | Ht 69.0 in | Wt 282.0 lb

## 2014-08-21 DIAGNOSIS — D120 Benign neoplasm of cecum: Secondary | ICD-10-CM

## 2014-08-21 DIAGNOSIS — K621 Rectal polyp: Secondary | ICD-10-CM | POA: Diagnosis not present

## 2014-08-21 DIAGNOSIS — Z1211 Encounter for screening for malignant neoplasm of colon: Secondary | ICD-10-CM | POA: Diagnosis present

## 2014-08-21 DIAGNOSIS — D128 Benign neoplasm of rectum: Secondary | ICD-10-CM

## 2014-08-21 HISTORY — PX: COLONOSCOPY: SHX174

## 2014-08-21 MED ORDER — SODIUM CHLORIDE 0.9 % IV SOLN: 500.0000 mL | INTRAVENOUS | Status: DC

## 2014-08-21 NOTE — Op Note (Signed)
[place]  [address] [place], [id number]   COLONOSCOPY PROCEDURE REPORT  PATIENT: Patrick Hoffman, Patrick Hoffman  MR#: [id number] BIRTHDATE: 20-Jun-1960 , 21  yrs. old GENDER: male ENDOSCOPIST: Jerene Bears, MD REFERRED FY:TWKM Perini, M.D. PROCEDURE DATE:  08/21/2014 PROCEDURE:   Colonoscopy with snare polypectomy and Colonoscopy, screening First Screening Colonoscopy - Avg.  risk and is 50 yrs.  old or older Yes.  Prior Negative Screening - Now for repeat screening. N/A  History of Adenoma - Now for follow-up colonoscopy & has been > or = to 3 yrs.  N/A  Polyps Removed Today ASA CLASS:   Class III INDICATIONS:Screening for colonic neoplasia and Colorectal Neoplasm Risk Assessment for this procedure is average risk. MEDICATIONS: Monitored anesthesia care and Propofol 300 mg IV  DESCRIPTION OF PROCEDURE:   After the risks benefits and alternatives of the procedure were thoroughly explained, informed consent was obtained.  The digital rectal exam revealed no rectal mass.   The LB CF-H180AL Loaner [id number]  endoscope was introduced through the anus and advanced to the cecum, which was identified by both the appendix and ileocecal valve. No adverse events experienced.   The quality of the prep was good.  (MoviPrep was used)  The instrument was then slowly withdrawn as the colon was fully examined.  COLON FINDINGS: Three sessile polyps ranging between 3-3mm in size were found at the cecum (1) and in the rectum (2).  Polypectomies were performed with a cold snare.  The resection was complete, the polyp tissue was completely retrieved and sent to histology. There was moderate diverticulosis noted in the descending colon and sigmoid colon.  Retroflexed views revealed no abnormalities. The time to cecum = 2.3 Withdrawal time = 11.9   The scope was withdrawn and the procedure completed. COMPLICATIONS: There were no immediate complications.  ENDOSCOPIC IMPRESSION: 1.   Three  sessile polyps ranging between 3-58mm in size were found at the cecum and in the rectum; polypectomies were performed with a cold snare 2.   Moderate diverticulosis was noted in the descending colon and sigmoid colon  RECOMMENDATIONS: 1.  Await pathology results 2.  High fiber diet 3.  Timing of repeat colonoscopy will be determined by pathology findings. 4.  You will receive a letter within 1-2 weeks with the results of your biopsy as well as final recommendations.  Please call my office if you have not received a letter after 3 weeks.  eSigned:  Jerene Bears, MD 08/21/2014 9:06 AM   cc: Crist Infante, MD and The Patient

## 2014-08-21 NOTE — Progress Notes (Signed)
A/ox3 pleased with MAC, report to [employer]

## 2014-08-21 NOTE — Patient Instructions (Signed)
YOU HAD AN ENDOSCOPIC PROCEDURE TODAY AT THE [facility]:   Refer to the procedure report that was given to you for any specific questions about what was found during the examination.  If the procedure report does not answer your questions, please call your gastroenterologist to clarify.  If you requested that your care partner not be given the details of your procedure findings, then the procedure report has been included in a sealed envelope for you to review at your convenience later.  YOU SHOULD EXPECT: Some feelings of bloating in the abdomen. Passage of more gas than usual.  Walking can help get rid of the air that was put into your GI tract during the procedure and reduce the bloating. If you had a lower endoscopy (such as a colonoscopy or flexible sigmoidoscopy) you may notice spotting of blood in your stool or on the toilet paper. If you underwent a bowel prep for your procedure, you may not have a normal bowel movement for a few days.  Please Note:  You might notice some irritation and congestion in your nose or some drainage.  This is from the oxygen used during your procedure.  There is no need for concern and it should clear up in a day or so.  SYMPTOMS TO REPORT IMMEDIATELY:   Following lower endoscopy (colonoscopy or flexible sigmoidoscopy):  Excessive amounts of blood in the stool  Significant tenderness or worsening of abdominal pains  Swelling of the abdomen that is new, acute  Fever of 100F or higher   For urgent or emergent issues, a gastroenterologist can be reached at any hour by calling [phone].   DIET: Your first meal following the procedure should be a small meal and then it is ok to progress to your normal diet. Heavy or fried foods are harder to digest and may make you feel nauseous or bloated.  Likewise, meals heavy in dairy and vegetables can increase bloating.  Drink plenty of fluids but you should avoid alcoholic beverages for 24  hours.  ACTIVITY:  You should plan to take it easy for the rest of today and you should NOT DRIVE or use heavy machinery until tomorrow (because of the sedation medicines used during the test).    FOLLOW UP: Our staff will call the number listed on your records the next business day following your procedure to check on you and address any questions or concerns that you may have regarding the information given to you following your procedure. If we do not reach you, we will leave a message.  However, if you are feeling well and you are not experiencing any problems, there is no need to return our call.  We will assume that you have returned to your regular daily activities without incident.  If any biopsies were taken you will be contacted by phone or by letter within the next 1-3 weeks.  Please call us at [phone] if you have not heard about the biopsies in 3 weeks.    SIGNATURES/CONFIDENTIALITY: You and/or your care partner have signed paperwork which will be entered into your electronic medical record.  These signatures attest to the fact that that the information above on your After Visit Summary has been reviewed and is understood.  Full responsibility of the confidentiality of this discharge information lies with you and/or your care-partner. 

## 2014-08-21 NOTE — Progress Notes (Signed)
Called to room to assist during endoscopic procedure.  Patient ID and intended procedure confirmed with present staff. Received instructions for my participation in the procedure from the performing physician.  

## 2014-08-24 ENCOUNTER — Telehealth: Payer: Self-pay | Admitting: *Deleted

## 2014-08-24 NOTE — Telephone Encounter (Signed)
  Follow up Call-  Call back number 08/21/2014  Post procedure Call Back phone  # [phone]  Permission to leave phone message Yes     Patient questions:  Do you have a fever, pain , or abdominal swelling? No. Pain Score  0 *  Have you tolerated food without any problems? Yes.    Have you been able to return to your normal activities? Yes.    Do you have any questions about your discharge instructions: Diet   No. Medications  No. Follow up visit  No.  Do you have questions or concerns about your Care? No.  Actions: * If pain score is 4 or above: No action needed, pain <4. Patient still at work this am, back to normal per contact.

## 2014-08-27 ENCOUNTER — Encounter: Payer: Self-pay | Admitting: Internal Medicine

## 2016-01-24 DIAGNOSIS — Z0000 Encounter for general adult medical examination without abnormal findings: Secondary | ICD-10-CM | POA: Diagnosis not present

## 2016-01-24 DIAGNOSIS — R7301 Impaired fasting glucose: Secondary | ICD-10-CM | POA: Diagnosis not present

## 2016-01-24 DIAGNOSIS — M109 Gout, unspecified: Secondary | ICD-10-CM | POA: Diagnosis not present

## 2016-01-24 DIAGNOSIS — Z125 Encounter for screening for malignant neoplasm of prostate: Secondary | ICD-10-CM | POA: Diagnosis not present

## 2016-02-01 DIAGNOSIS — Z1389 Encounter for screening for other disorder: Secondary | ICD-10-CM | POA: Diagnosis not present

## 2016-02-01 DIAGNOSIS — Z87898 Personal history of other specified conditions: Secondary | ICD-10-CM | POA: Diagnosis not present

## 2016-02-01 DIAGNOSIS — Z0000 Encounter for general adult medical examination without abnormal findings: Secondary | ICD-10-CM | POA: Diagnosis not present

## 2016-02-01 DIAGNOSIS — Z6838 Body mass index (BMI) 38.0-38.9, adult: Secondary | ICD-10-CM | POA: Diagnosis not present

## 2016-02-01 DIAGNOSIS — D860 Sarcoidosis of lung: Secondary | ICD-10-CM | POA: Diagnosis not present

## 2016-02-01 DIAGNOSIS — M25551 Pain in right hip: Secondary | ICD-10-CM | POA: Diagnosis not present

## 2016-02-04 DIAGNOSIS — Z1212 Encounter for screening for malignant neoplasm of rectum: Secondary | ICD-10-CM | POA: Diagnosis not present

## 2016-03-27 DIAGNOSIS — M109 Gout, unspecified: Secondary | ICD-10-CM | POA: Diagnosis not present

## 2016-03-27 DIAGNOSIS — R945 Abnormal results of liver function studies: Secondary | ICD-10-CM | POA: Diagnosis not present

## 2017-03-19 DIAGNOSIS — R7301 Impaired fasting glucose: Secondary | ICD-10-CM | POA: Diagnosis not present

## 2017-03-19 DIAGNOSIS — Z0000 Encounter for general adult medical examination without abnormal findings: Secondary | ICD-10-CM | POA: Diagnosis not present

## 2017-03-19 DIAGNOSIS — I10 Essential (primary) hypertension: Secondary | ICD-10-CM | POA: Diagnosis not present

## 2017-03-19 DIAGNOSIS — Z125 Encounter for screening for malignant neoplasm of prostate: Secondary | ICD-10-CM | POA: Diagnosis not present

## 2017-03-19 DIAGNOSIS — M109 Gout, unspecified: Secondary | ICD-10-CM | POA: Diagnosis not present

## 2017-03-30 DIAGNOSIS — Z0000 Encounter for general adult medical examination without abnormal findings: Secondary | ICD-10-CM | POA: Diagnosis not present

## 2017-03-30 DIAGNOSIS — Z1212 Encounter for screening for malignant neoplasm of rectum: Secondary | ICD-10-CM | POA: Diagnosis not present

## 2017-03-30 DIAGNOSIS — D860 Sarcoidosis of lung: Secondary | ICD-10-CM | POA: Diagnosis not present

## 2017-03-30 DIAGNOSIS — I10 Essential (primary) hypertension: Secondary | ICD-10-CM | POA: Diagnosis not present

## 2017-03-30 DIAGNOSIS — M109 Gout, unspecified: Secondary | ICD-10-CM | POA: Diagnosis not present

## 2017-03-30 DIAGNOSIS — Z87898 Personal history of other specified conditions: Secondary | ICD-10-CM | POA: Diagnosis not present

## 2017-03-30 DIAGNOSIS — Z1389 Encounter for screening for other disorder: Secondary | ICD-10-CM | POA: Diagnosis not present

## 2017-04-23 DIAGNOSIS — R509 Fever, unspecified: Secondary | ICD-10-CM | POA: Diagnosis not present

## 2017-04-23 DIAGNOSIS — D869 Sarcoidosis, unspecified: Secondary | ICD-10-CM | POA: Diagnosis not present

## 2017-04-23 DIAGNOSIS — R05 Cough: Secondary | ICD-10-CM | POA: Diagnosis not present

## 2017-04-23 DIAGNOSIS — J181 Lobar pneumonia, unspecified organism: Secondary | ICD-10-CM | POA: Diagnosis not present

## 2017-04-26 ENCOUNTER — Telehealth: Payer: Self-pay | Admitting: Internal Medicine

## 2017-04-26 DIAGNOSIS — Z6841 Body Mass Index (BMI) 40.0 and over, adult: Secondary | ICD-10-CM | POA: Diagnosis not present

## 2017-04-26 DIAGNOSIS — D860 Sarcoidosis of lung: Secondary | ICD-10-CM | POA: Diagnosis not present

## 2017-04-26 DIAGNOSIS — J181 Lobar pneumonia, unspecified organism: Secondary | ICD-10-CM | POA: Diagnosis not present

## 2017-04-26 NOTE — Telephone Encounter (Signed)
LMOM TCB x1 for Patrick Hoffman with [facility] has openings here in [place] on 2.11.19 and in [place] on 2.21.19 Does patient need to be seen sooner than this?  If not, can go ahead and schedule appt

## 2017-04-27 NOTE — Telephone Encounter (Signed)
Misty with [facility], Dr. Silvestre Mesi office called.  States patient can be seen by anyone but would like sooner appointment than 02/11.  Dr. Lamonte Sakai had an opening for 05/16/2017 for consult and patient was scheduled for this appointment. It has been 3 years since the patient was last seen here , so they would need to re-establish as a new patient and can be with any provider per office protocol.  No call back is needed.  Misty will contact patient with appointment.

## 2017-04-27 NOTE — Telephone Encounter (Signed)
Ok noted  

## 2017-05-16 ENCOUNTER — Encounter: Payer: Self-pay | Admitting: Emergency Medicine

## 2017-05-16 ENCOUNTER — Ambulatory Visit (INDEPENDENT_AMBULATORY_CARE_PROVIDER_SITE_OTHER): Payer: BLUE CROSS/BLUE SHIELD | Admitting: Emergency Medicine

## 2017-05-16 DIAGNOSIS — J181 Lobar pneumonia, unspecified organism: Secondary | ICD-10-CM | POA: Diagnosis not present

## 2017-05-16 DIAGNOSIS — J189 Pneumonia, unspecified organism: Secondary | ICD-10-CM

## 2017-05-16 DIAGNOSIS — D869 Sarcoidosis, unspecified: Secondary | ICD-10-CM | POA: Diagnosis not present

## 2017-05-16 NOTE — Patient Instructions (Signed)
We will repeat your CT scan of the chest in March 2019 to compare with your priors. Depending on your symptoms, your CT scan results, we may at some point decide to repeat your breathing tests. We will not start any new breathing medications today. Remember that you need to follow-up with an ophthalmologist once a year. Follow with Dr Lamonte Sakai in March after your CT scan to review

## 2017-05-16 NOTE — Progress Notes (Signed)
Subjective:    Patient ID: Patrick Hoffman, male    DOB: 1960-12-03, 57 y.o.   MRN: [id number]  HPI 57 year old former smoker (30 pack years) with a history of hypertension, allergic rhinitis, obstructive sleep apnea (not on CPAP), and sarcoidosis that was diagnosed by mediastinoscopy by Dr Roxan Hockey in setting mediastinal and hilar LAD. He has been off BD's for at least 2-3 years.   He had a URI and then suspected RLL CAP based on CXR about 3 weeks ago. Was treated with levaquin. Has improved now. Breathing and cough are much better. No wheeze.  Never has had skin or ocular manifestations.   PFT from 2014 reviewed > no obstruction, probable restriction.                                                                                                                                                                                   Review of Systems  Constitutional: Negative for fever and unexpected weight change.  HENT: Negative for congestion, dental problem, ear pain, nosebleeds, postnasal drip, rhinorrhea, sinus pressure, sneezing, sore throat and trouble swallowing.   Eyes: Negative for redness and itching.  Respiratory: Negative for cough, chest tightness, shortness of breath and wheezing.   Cardiovascular: Negative for palpitations and leg swelling.  Gastrointestinal: Negative for nausea and vomiting.  Genitourinary: Negative for dysuria.  Musculoskeletal: Negative for joint swelling.  Skin: Negative for rash.  Neurological: Negative for headaches.  Hematological: Does not bruise/bleed easily.  Psychiatric/Behavioral: Negative for dysphoric mood. The patient is not nervous/anxious.    Past Medical History:  Diagnosis Date  . Allergic rhinitis   . Anxiety   . Asthma   . Broken shoulder    right shoulder approx 10 years ago  . Bronchitis   . Gilbert's syndrome   . Gout   . H/O asbestos exposure   . History of stomach ulcers   . Hypertension   . Lymphadenopathy   .  Migraine   . Nephrolithiasis   . OA (osteoarthritis) of knee    bilat  . Obesity   . Sarcoidosis of lung ([place])   . Shingles    on face, age 68yo and again at 57yo   . Sleep apnea    no cpap     Family History  Problem Relation Age of Onset  . COPD Mother   . Hypertension Mother   . COPD Father   . Hypertension Father   . Hypertension Brother   . Breast cancer Maternal Grandmother   . Heart disease Paternal Grandfather   . Colon cancer Neg Hx   . Rectal cancer Neg Hx   . Stomach cancer Neg Hx  Social History   Socioeconomic History  . Marital status: Married    Spouse name: susan  . Number of children: 1  . Years of education: 74  . Highest education level: Not on file  Social Needs  . Financial resource strain: Not on file  . Food insecurity - worry: Not on file  . Food insecurity - inability: Not on file  . Transportation needs - medical: Not on file  . Transportation needs - non-medical: Not on file  Occupational History  . 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 Smoking status: Former Smoker    Packs/day: 1.50    Years: 20.00    Pack years: 30.00    Types: Cigarettes    Last attempt to quit: 04/17/2009    Years since quitting: 8.0  . Smokeless tobacco: Never Used  Substance and Sexual Activity  . Alcohol use: Yes    Comment: rare  . Drug use: No  . Sexual activity: Not on file  Other Topics Concern  . Not on file  Social History Narrative   Patient is married [name]).   Patient has one daughter.   Hunt and fish, watch grandchild   Has an asbestos exposure in the past, 2 yrs.   No Known Allergies   Outpatient Medications Prior to Visit  Medication Sig Dispense Refill  . allopurinol (ZYLOPRIM) 300 MG tablet Take 300 mg by mouth daily.     Marland Kitchen amLODipine (NORVASC) 5 MG tablet Take 5 mg by mouth daily.     Marland Kitchen losartan (COZAAR) 100 MG tablet Take 100 mg by mouth daily.     . fenofibrate 160  MG tablet Take 160 mg by mouth daily.      No facility-administered medications prior to visit.         Objective:   Physical Exam Vitals:   05/16/17 1555 05/16/17 1556  BP:  (!) 132/92  Pulse:  95  SpO2:  97%  Weight: 245 lb (111.1 kg)   Height: 5\' 9"  (1.753 m)    Gen: Pleasant, overwt man, in no distress,  normal affect  ENT: No lesions,  mouth clear,  oropharynx clear, no postnasal drip  Neck: No JVD, no stridor  Lungs: No use of accessory muscles, clear without rales or rhonchi  Cardiovascular: RRR, heart sounds normal, no murmur or gallops, no peripheral edema  Musculoskeletal: No deformities, no cyanosis or clubbing  Neuro: alert, non focal  Skin: Warm, no lesions or rash     Assessment & Plan:  Community acquired pneumonia He completed therapy with Levaquin for a right lower lobe community acquired pneumonia.  Appears to be clinically improved.  We will repeat his imaging in March as above  Sarcoidosis We will repeat your CT scan of the chest in March 2019 to compare with your priors. Depending on your symptoms, your CT scan results, we may at some point decide to repeat your breathing tests. We will not start any new breathing medications today. Remember that you need to follow-up with an ophthalmologist once a year. Follow with Dr Lamonte Sakai in March after your CT scan to review  Baltazar Apo, MD, PhD 05/16/2017, 4:37 PM [facility] [phone] or if no answer [phone]

## 2017-05-16 NOTE — Assessment & Plan Note (Signed)
We will repeat your CT scan of the chest in March 2019 to compare with your priors. Depending on your symptoms, your CT scan results, we may at some point decide to repeat your breathing tests. We will not start any new breathing medications today. Remember that you need to follow-up with an ophthalmologist once a year. Follow with Dr Lamonte Sakai in March after your CT scan to review

## 2017-05-16 NOTE — Assessment & Plan Note (Signed)
He completed therapy with Levaquin for a right lower lobe community acquired pneumonia.  Appears to be clinically improved.  We will repeat his imaging in March as above

## 2017-06-18 ENCOUNTER — Ambulatory Visit (INDEPENDENT_AMBULATORY_CARE_PROVIDER_SITE_OTHER)
Admission: RE | Admit: 2017-06-18 | Discharge: 2017-06-18 | Disposition: A | Discharge status: Home / Self Care | Payer: BLUE CROSS/BLUE SHIELD | Source: Ambulatory Visit | Attending: Emergency Medicine | Admitting: Emergency Medicine

## 2017-06-18 DIAGNOSIS — D869 Sarcoidosis, unspecified: Secondary | ICD-10-CM

## 2017-06-18 DIAGNOSIS — R918 Other nonspecific abnormal finding of lung field: Secondary | ICD-10-CM | POA: Diagnosis not present

## 2017-07-02 ENCOUNTER — Encounter: Payer: Self-pay | Admitting: Emergency Medicine

## 2017-07-02 ENCOUNTER — Ambulatory Visit (INDEPENDENT_AMBULATORY_CARE_PROVIDER_SITE_OTHER): Payer: BLUE CROSS/BLUE SHIELD | Admitting: Emergency Medicine

## 2017-07-02 DIAGNOSIS — D869 Sarcoidosis, unspecified: Secondary | ICD-10-CM

## 2017-07-02 NOTE — Progress Notes (Signed)
Subjective:    Patient ID: Patrick Hoffman, male    DOB: Sep 03, 1960, 57 y.o.   MRN: [id number]  HPI 57 year old former smoker (30 pack years) with a history of hypertension, allergic rhinitis, obstructive sleep apnea (not on CPAP), and sarcoidosis that was diagnosed by mediastinoscopy by Dr Roxan Hockey in setting mediastinal and hilar LAD. He has been off BD's for at least 2-3 years.   He had a URI and then suspected RLL CAP based on CXR about 3 weeks ago. Was treated with levaquin. Has improved now. Breathing and cough are much better. No wheeze.  Never has had skin or ocular manifestations.   PFT from 2014 reviewed > no obstruction, probable restriction.   ROV 07/02/17 --57 year old gentleman with a history of hypertension, former tobacco, obstructive sleep apnea and sarcoidosis diagnosed by me just on lymph node biopsy.  He was treated recently for a community acquired pneumonia as above.  We decide to repeat his scan that was done on 06/18/17 and I have reviewed. The RLL is now cleared.                                                                                                                                                                                   Review of Systems  Constitutional: Negative for fever and unexpected weight change.  HENT: Negative for congestion, dental problem, ear pain, nosebleeds, postnasal drip, rhinorrhea, sinus pressure, sneezing, sore throat and trouble swallowing.   Eyes: Negative for redness and itching.  Respiratory: Negative for cough, chest tightness, shortness of breath and wheezing.   Cardiovascular: Negative for palpitations and leg swelling.  Gastrointestinal: Negative for nausea and vomiting.  Genitourinary: Negative for dysuria.  Musculoskeletal: Negative for joint swelling.  Skin: Negative for rash.  Neurological: Negative for headaches.  Hematological: Does not bruise/bleed easily.  Psychiatric/Behavioral: Negative for dysphoric mood. The  patient is not nervous/anxious.    Past Medical History:  Diagnosis Date  . Allergic rhinitis   . Anxiety   . Asthma   . Broken shoulder    right shoulder approx 10 years ago  . Bronchitis   . Gilbert's syndrome   . Gout   . H/O asbestos exposure   . History of stomach ulcers   . Hypertension   . Lymphadenopathy   . Migraine   . Nephrolithiasis   . OA (osteoarthritis) of knee    bilat  . Obesity   . Sarcoidosis of lung ([place])   . Shingles    on face, age 57yo and again at 57yo   . Sleep apnea    no cpap     Family History  Problem Relation Age of Onset  .  COPD Mother   . Hypertension Mother   . COPD Father   . Hypertension Father   . Hypertension Brother   . Breast cancer Maternal Grandmother   . Heart disease Paternal Grandfather   . Colon cancer Neg Hx   . Rectal cancer Neg Hx   . Stomach cancer Neg Hx      Social History   Socioeconomic History  . Marital status: Married    Spouse name: susan  . Number of children: 1  . Years of education: 27  . Highest education level: Not on file  Social Needs  . Financial resource strain: Not on file  . Food insecurity - worry: Not on file  . Food insecurity - inability: Not on file  . Transportation needs - medical: Not on file  . Transportation needs - non-medical: Not on file  Occupational History  . 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 Smoking status: Former Smoker    Packs/day: 1.50    Years: 20.00    Pack years: 30.00    Types: Cigarettes    Last attempt to quit: 04/17/2009    Years since quitting: 8.2  . Smokeless tobacco: Never Used  Substance and Sexual Activity  . Alcohol use: Yes    Comment: rare  . Drug use: No  . Sexual activity: Not on file  Other Topics Concern  . Not on file  Social History Narrative   Patient is married [name]).   Patient has one daughter.   Hunt and fish, watch grandchild   Has an asbestos exposure in the  past, 2 yrs.   No Known Allergies   Outpatient Medications Prior to Visit  Medication Sig Dispense Refill  . allopurinol (ZYLOPRIM) 300 MG tablet Take 300 mg by mouth daily.     Marland Kitchen amLODipine (NORVASC) 5 MG tablet Take 5 mg by mouth daily.     Marland Kitchen losartan (COZAAR) 100 MG tablet Take 100 mg by mouth daily.      No facility-administered medications prior to visit.         Objective:   Physical Exam Vitals:   07/02/17 0855  BP: 122/80  Pulse: 86  SpO2: 94%  Weight: 254 lb (115.2 kg)  Height: 5\' 10"  (1.778 m)   Gen: Pleasant, overwt man, in no distress,  normal affect  ENT: No lesions,  mouth clear,  oropharynx clear, no postnasal drip  Neck: No JVD, no stridor  Lungs: No use of accessory muscles, clear without rales or rhonchi  Cardiovascular: RRR, heart sounds normal, no murmur or gallops, no peripheral edema  Musculoskeletal: No deformities, no cyanosis or clubbing  Neuro: alert, non focal  Skin: Warm, no lesions or rash     Assessment & Plan:  Sarcoidosis Clinically improved from his recent community acquired pneumonia.  His CT scan does not show any residual right lower lobe infiltrate.  He does have chronic lymphadenopathy and has evolved some very subtle lingular scar/bronchiectasis.  No indication to change his regimen at this time.  In absence of dyspnea I think we can defer pulmonary function testing for now  We will not start any scheduled inhaled medications at this time.  Your CT scan does not show any evidence of active sarcoidosis  We will hold off on any breathing tests for now Use OTC anti-histamines for your allergy symptoms as needed.  Get your opthalmology testing annually  Follow with Dr Lamonte Sakai in 12 months  or sooner if you have any problems  Baltazar Apo, MD, PhD 07/02/2017, 9:22 AM [facility] [phone] or if no answer [phone]

## 2017-07-02 NOTE — Patient Instructions (Addendum)
We will not start any scheduled inhaled medications at this time.  Your CT scan does not show any evidence of active sarcoidosis  We will hold off on any breathing tests for now Use OTC anti-histamines for your allergy symptoms as needed.  Get your opthalmology testing annually  Follow with Dr Lamonte Sakai in 12 months or sooner if you have any problems

## 2017-07-02 NOTE — Assessment & Plan Note (Signed)
Clinically improved from his recent community acquired pneumonia.  His CT scan does not show any residual right lower lobe infiltrate.  He does have chronic lymphadenopathy and has evolved some very subtle lingular scar/bronchiectasis.  No indication to change his regimen at this time.  In absence of dyspnea I think we can defer pulmonary function testing for now  We will not start any scheduled inhaled medications at this time.  Your CT scan does not show any evidence of active sarcoidosis  We will hold off on any breathing tests for now Use OTC anti-histamines for your allergy symptoms as needed.  Get your opthalmology testing annually  Follow with Dr Lamonte Sakai in 12 months or sooner if you have any problems

## 2018-04-26 DIAGNOSIS — Z0000 Encounter for general adult medical examination without abnormal findings: Secondary | ICD-10-CM | POA: Diagnosis not present

## 2018-04-26 DIAGNOSIS — M109 Gout, unspecified: Secondary | ICD-10-CM | POA: Diagnosis not present

## 2018-04-26 DIAGNOSIS — R7301 Impaired fasting glucose: Secondary | ICD-10-CM | POA: Diagnosis not present

## 2018-04-26 DIAGNOSIS — R809 Proteinuria, unspecified: Secondary | ICD-10-CM | POA: Diagnosis not present

## 2018-04-26 DIAGNOSIS — R82998 Other abnormal findings in urine: Secondary | ICD-10-CM | POA: Diagnosis not present

## 2018-04-26 DIAGNOSIS — E7849 Other hyperlipidemia: Secondary | ICD-10-CM | POA: Diagnosis not present

## 2018-05-02 DIAGNOSIS — I10 Essential (primary) hypertension: Secondary | ICD-10-CM | POA: Diagnosis not present

## 2018-05-02 DIAGNOSIS — R945 Abnormal results of liver function studies: Secondary | ICD-10-CM | POA: Diagnosis not present

## 2018-05-02 DIAGNOSIS — Z1331 Encounter for screening for depression: Secondary | ICD-10-CM | POA: Diagnosis not present

## 2018-05-02 DIAGNOSIS — R808 Other proteinuria: Secondary | ICD-10-CM | POA: Diagnosis not present

## 2018-05-02 DIAGNOSIS — Z0000 Encounter for general adult medical examination without abnormal findings: Secondary | ICD-10-CM | POA: Diagnosis not present

## 2018-05-02 DIAGNOSIS — D860 Sarcoidosis of lung: Secondary | ICD-10-CM | POA: Diagnosis not present

## 2018-05-08 DIAGNOSIS — Z1212 Encounter for screening for malignant neoplasm of rectum: Secondary | ICD-10-CM | POA: Diagnosis not present

## 2018-05-23 DIAGNOSIS — M25562 Pain in left knee: Secondary | ICD-10-CM | POA: Diagnosis not present

## 2018-05-24 DIAGNOSIS — M25562 Pain in left knee: Secondary | ICD-10-CM | POA: Diagnosis not present

## 2018-05-24 DIAGNOSIS — M25462 Effusion, left knee: Secondary | ICD-10-CM | POA: Diagnosis not present

## 2018-06-10 DIAGNOSIS — Z6841 Body Mass Index (BMI) 40.0 and over, adult: Secondary | ICD-10-CM | POA: Diagnosis not present

## 2018-06-10 DIAGNOSIS — M769 Unspecified enthesopathy, lower limb, excluding foot: Secondary | ICD-10-CM | POA: Diagnosis not present

## 2018-06-25 DIAGNOSIS — E7849 Other hyperlipidemia: Secondary | ICD-10-CM | POA: Diagnosis not present

## 2018-09-25 DIAGNOSIS — R7301 Impaired fasting glucose: Secondary | ICD-10-CM | POA: Diagnosis not present

## 2018-09-25 DIAGNOSIS — Z1331 Encounter for screening for depression: Secondary | ICD-10-CM | POA: Diagnosis not present

## 2018-09-25 DIAGNOSIS — M769 Unspecified enthesopathy, lower limb, excluding foot: Secondary | ICD-10-CM | POA: Diagnosis not present

## 2018-09-25 DIAGNOSIS — M25561 Pain in right knee: Secondary | ICD-10-CM | POA: Diagnosis not present

## 2018-09-25 DIAGNOSIS — Z79899 Other long term (current) drug therapy: Secondary | ICD-10-CM | POA: Diagnosis not present

## 2018-09-25 DIAGNOSIS — R609 Edema, unspecified: Secondary | ICD-10-CM | POA: Diagnosis not present

## 2018-09-25 DIAGNOSIS — R945 Abnormal results of liver function studies: Secondary | ICD-10-CM | POA: Diagnosis not present

## 2018-09-25 DIAGNOSIS — D860 Sarcoidosis of lung: Secondary | ICD-10-CM | POA: Diagnosis not present

## 2018-09-26 DIAGNOSIS — M25562 Pain in left knee: Secondary | ICD-10-CM | POA: Diagnosis not present

## 2018-09-26 DIAGNOSIS — M25561 Pain in right knee: Secondary | ICD-10-CM | POA: Diagnosis not present

## 2018-11-30 DIAGNOSIS — M545 Low back pain: Secondary | ICD-10-CM | POA: Diagnosis not present

## 2019-02-24 ENCOUNTER — Other Ambulatory Visit: Payer: Self-pay

## 2019-02-24 DIAGNOSIS — Z20822 Contact with and (suspected) exposure to covid-19: Secondary | ICD-10-CM

## 2019-02-25 LAB — NOVEL CORONAVIRUS, NAA: SARS-CoV-2, NAA: NOT DETECTED

## 2019-06-23 DIAGNOSIS — E7849 Other hyperlipidemia: Secondary | ICD-10-CM | POA: Diagnosis not present

## 2019-06-23 DIAGNOSIS — Z125 Encounter for screening for malignant neoplasm of prostate: Secondary | ICD-10-CM | POA: Diagnosis not present

## 2019-06-23 DIAGNOSIS — M109 Gout, unspecified: Secondary | ICD-10-CM | POA: Diagnosis not present

## 2019-06-23 DIAGNOSIS — Z0000 Encounter for general adult medical examination without abnormal findings: Secondary | ICD-10-CM | POA: Diagnosis not present

## 2019-06-30 DIAGNOSIS — Z0000 Encounter for general adult medical examination without abnormal findings: Secondary | ICD-10-CM | POA: Diagnosis not present

## 2019-06-30 DIAGNOSIS — Z1331 Encounter for screening for depression: Secondary | ICD-10-CM | POA: Diagnosis not present

## 2019-06-30 DIAGNOSIS — M769 Unspecified enthesopathy, lower limb, excluding foot: Secondary | ICD-10-CM | POA: Diagnosis not present

## 2019-06-30 DIAGNOSIS — I10 Essential (primary) hypertension: Secondary | ICD-10-CM | POA: Diagnosis not present

## 2019-06-30 DIAGNOSIS — R82998 Other abnormal findings in urine: Secondary | ICD-10-CM | POA: Diagnosis not present

## 2019-06-30 DIAGNOSIS — R609 Edema, unspecified: Secondary | ICD-10-CM | POA: Diagnosis not present

## 2019-06-30 DIAGNOSIS — Z1212 Encounter for screening for malignant neoplasm of rectum: Secondary | ICD-10-CM | POA: Diagnosis not present

## 2019-06-30 DIAGNOSIS — R945 Abnormal results of liver function studies: Secondary | ICD-10-CM | POA: Diagnosis not present

## 2019-06-30 DIAGNOSIS — M25561 Pain in right knee: Secondary | ICD-10-CM | POA: Diagnosis not present

## 2019-06-30 DIAGNOSIS — R7301 Impaired fasting glucose: Secondary | ICD-10-CM | POA: Diagnosis not present

## 2019-06-30 DIAGNOSIS — D860 Sarcoidosis of lung: Secondary | ICD-10-CM | POA: Diagnosis not present

## 2019-08-07 DIAGNOSIS — M25562 Pain in left knee: Secondary | ICD-10-CM | POA: Diagnosis not present

## 2019-08-07 DIAGNOSIS — M79671 Pain in right foot: Secondary | ICD-10-CM | POA: Diagnosis not present

## 2019-08-07 DIAGNOSIS — M25561 Pain in right knee: Secondary | ICD-10-CM | POA: Diagnosis not present

## 2020-01-05 DIAGNOSIS — R7301 Impaired fasting glucose: Secondary | ICD-10-CM | POA: Diagnosis not present

## 2020-01-05 DIAGNOSIS — I10 Essential (primary) hypertension: Secondary | ICD-10-CM | POA: Diagnosis not present

## 2020-01-05 DIAGNOSIS — Z23 Encounter for immunization: Secondary | ICD-10-CM | POA: Diagnosis not present

## 2020-04-26 DIAGNOSIS — Z1152 Encounter for screening for COVID-19: Secondary | ICD-10-CM | POA: Diagnosis not present

## 2020-07-05 DIAGNOSIS — M109 Gout, unspecified: Secondary | ICD-10-CM | POA: Diagnosis not present

## 2020-07-05 DIAGNOSIS — E785 Hyperlipidemia, unspecified: Secondary | ICD-10-CM | POA: Diagnosis not present

## 2020-07-05 DIAGNOSIS — Z125 Encounter for screening for malignant neoplasm of prostate: Secondary | ICD-10-CM | POA: Diagnosis not present

## 2020-07-05 DIAGNOSIS — R7301 Impaired fasting glucose: Secondary | ICD-10-CM | POA: Diagnosis not present

## 2020-07-12 ENCOUNTER — Other Ambulatory Visit: Payer: Self-pay | Admitting: Internal Medicine

## 2020-07-12 DIAGNOSIS — Z0000 Encounter for general adult medical examination without abnormal findings: Secondary | ICD-10-CM | POA: Diagnosis not present

## 2020-07-12 DIAGNOSIS — I10 Essential (primary) hypertension: Secondary | ICD-10-CM | POA: Diagnosis not present

## 2020-07-12 DIAGNOSIS — R82998 Other abnormal findings in urine: Secondary | ICD-10-CM | POA: Diagnosis not present

## 2020-07-12 DIAGNOSIS — Z1212 Encounter for screening for malignant neoplasm of rectum: Secondary | ICD-10-CM | POA: Diagnosis not present

## 2020-07-12 DIAGNOSIS — E785 Hyperlipidemia, unspecified: Secondary | ICD-10-CM

## 2020-07-12 DIAGNOSIS — D860 Sarcoidosis of lung: Secondary | ICD-10-CM | POA: Diagnosis not present

## 2020-08-02 ENCOUNTER — Ambulatory Visit
Admission: RE | Admit: 2020-08-02 | Discharge: 2020-08-02 | Disposition: A | Discharge status: Home / Self Care | Payer: No Typology Code available for payment source | Source: Ambulatory Visit | Attending: Internal Medicine | Admitting: Internal Medicine

## 2020-08-02 DIAGNOSIS — E785 Hyperlipidemia, unspecified: Secondary | ICD-10-CM

## 2020-09-24 DIAGNOSIS — E785 Hyperlipidemia, unspecified: Secondary | ICD-10-CM | POA: Diagnosis not present

## 2020-12-23 DIAGNOSIS — T63421A Toxic effect of venom of ants, accidental (unintentional), initial encounter: Secondary | ICD-10-CM | POA: Diagnosis not present

## 2021-01-03 DIAGNOSIS — R7301 Impaired fasting glucose: Secondary | ICD-10-CM | POA: Diagnosis not present

## 2021-01-03 DIAGNOSIS — Z23 Encounter for immunization: Secondary | ICD-10-CM | POA: Diagnosis not present

## 2021-01-03 DIAGNOSIS — I10 Essential (primary) hypertension: Secondary | ICD-10-CM | POA: Diagnosis not present

## 2021-05-29 DIAGNOSIS — M7752 Other enthesopathy of left foot: Secondary | ICD-10-CM | POA: Diagnosis not present

## 2021-08-22 DIAGNOSIS — I10 Essential (primary) hypertension: Secondary | ICD-10-CM | POA: Diagnosis not present

## 2021-08-22 DIAGNOSIS — M109 Gout, unspecified: Secondary | ICD-10-CM | POA: Diagnosis not present

## 2021-08-22 DIAGNOSIS — Z125 Encounter for screening for malignant neoplasm of prostate: Secondary | ICD-10-CM | POA: Diagnosis not present

## 2021-08-22 DIAGNOSIS — R7301 Impaired fasting glucose: Secondary | ICD-10-CM | POA: Diagnosis not present

## 2021-08-29 DIAGNOSIS — N390 Urinary tract infection, site not specified: Secondary | ICD-10-CM | POA: Diagnosis not present

## 2021-08-29 DIAGNOSIS — Z1331 Encounter for screening for depression: Secondary | ICD-10-CM | POA: Diagnosis not present

## 2021-08-29 DIAGNOSIS — Z1389 Encounter for screening for other disorder: Secondary | ICD-10-CM | POA: Diagnosis not present

## 2021-08-29 DIAGNOSIS — I2510 Atherosclerotic heart disease of native coronary artery without angina pectoris: Secondary | ICD-10-CM | POA: Diagnosis not present

## 2021-08-29 DIAGNOSIS — Z0000 Encounter for general adult medical examination without abnormal findings: Secondary | ICD-10-CM | POA: Diagnosis not present

## 2021-08-29 DIAGNOSIS — Z23 Encounter for immunization: Secondary | ICD-10-CM | POA: Diagnosis not present

## 2021-09-20 DIAGNOSIS — R945 Abnormal results of liver function studies: Secondary | ICD-10-CM | POA: Diagnosis not present

## 2021-09-20 DIAGNOSIS — R82998 Other abnormal findings in urine: Secondary | ICD-10-CM | POA: Diagnosis not present

## 2021-09-26 ENCOUNTER — Other Ambulatory Visit: Payer: Self-pay | Admitting: Internal Medicine

## 2021-10-17 ENCOUNTER — Ambulatory Visit (AMBULATORY_SURGERY_CENTER): Payer: Self-pay | Admitting: *Deleted

## 2021-10-17 VITALS — Ht 70.0 in | Wt 265.0 lb

## 2021-10-17 DIAGNOSIS — Z86010 Personal history of colonic polyps: Secondary | ICD-10-CM

## 2021-10-17 MED ORDER — NA SULFATE-K SULFATE-MG SULF 17.5-3.13-1.6 GM/177ML PO SOLN: 1.0000 | ORAL | 0 refills | Status: DC

## 2021-10-17 NOTE — Progress Notes (Signed)
Patient is here in-person for PV. Patient denies any allergies to eggs or soy. Patient denies any problems with anesthesia/sedation. Patient is not on any oxygen at home. Patient is not taking any diet/weight loss medications or blood thinners. Went over procedure prep instructions with the patient. Patient is aware of our care-partner policy. Patient notified to use Single[employer] card given for suprep rx.   EMMI education assigned to the patient for the procedure, sent to [place].

## 2021-11-09 ENCOUNTER — Ambulatory Visit
Admission: RE | Admit: 2021-11-09 | Discharge: 2021-11-09 | Disposition: A | Discharge status: Home / Self Care | Payer: BC Managed Care – PPO | Source: Ambulatory Visit | Attending: Internal Medicine | Admitting: Internal Medicine

## 2021-11-23 ENCOUNTER — Encounter: Payer: Self-pay | Admitting: Internal Medicine

## 2021-11-30 ENCOUNTER — Ambulatory Visit (AMBULATORY_SURGERY_CENTER): Payer: BC Managed Care – PPO | Admitting: Internal Medicine

## 2021-11-30 ENCOUNTER — Encounter: Payer: Self-pay | Admitting: Internal Medicine

## 2021-11-30 VITALS — BP 129/74 | HR 88 | Temp 99.6°F | Resp 17 | Ht 70.0 in | Wt 265.0 lb

## 2021-11-30 DIAGNOSIS — Z09 Encounter for follow-up examination after completed treatment for conditions other than malignant neoplasm: Secondary | ICD-10-CM

## 2021-11-30 DIAGNOSIS — Z86010 Personal history of colonic polyps: Secondary | ICD-10-CM | POA: Diagnosis not present

## 2021-11-30 DIAGNOSIS — Z1211 Encounter for screening for malignant neoplasm of colon: Secondary | ICD-10-CM | POA: Diagnosis not present

## 2021-11-30 MED ORDER — SODIUM CHLORIDE 0.9 % IV SOLN: 500.0000 mL | Freq: Once | INTRAVENOUS | Status: DC

## 2021-11-30 NOTE — Progress Notes (Signed)
Pt's states no medical or surgical changes since previsit or office visit. 

## 2021-11-30 NOTE — Progress Notes (Signed)
Report given to PACU, vss 

## 2021-11-30 NOTE — Patient Instructions (Signed)
Handouts provided on diverticulosis and hemorrhoids.   Repeat colonoscopy in 10 years for screening purposes.   YOU HAD AN ENDOSCOPIC PROCEDURE TODAY AT [facility]:   Refer to the procedure report that was given to you for any specific questions about what was found during the examination.  If the procedure report does not answer your questions, please call your gastroenterologist to clarify.  If you requested that your care partner not be given the details of your procedure findings, then the procedure report has been included in a sealed envelope for you to review at your convenience later.  YOU SHOULD EXPECT: Some feelings of bloating in the abdomen. Passage of more gas than usual.  Walking can help get rid of the air that was put into your GI tract during the procedure and reduce the bloating. If you had a lower endoscopy (such as a colonoscopy or flexible sigmoidoscopy) you may notice spotting of blood in your stool or on the toilet paper. If you underwent a bowel prep for your procedure, you may not have a normal bowel movement for a few days.  Please Note:  You might notice some irritation and congestion in your nose or some drainage.  This is from the oxygen used during your procedure.  There is no need for concern and it should clear up in a day or so.  SYMPTOMS TO REPORT IMMEDIATELY:  Following lower endoscopy (colonoscopy or flexible sigmoidoscopy):  Excessive amounts of blood in the stool  Significant tenderness or worsening of abdominal pains  Swelling of the abdomen that is new, acute  Fever of 100F or higher  For urgent or emergent issues, a gastroenterologist can be reached at any hour by calling [phone]. Do not use MyChart messaging for urgent concerns.    DIET:  We do recommend a small meal at first, but then you may proceed to your regular diet.  Drink plenty of fluids but you should avoid alcoholic beverages for 24 hours.  ACTIVITY:  You should  plan to take it easy for the rest of today and you should NOT DRIVE or use heavy machinery until tomorrow (because of the sedation medicines used during the test).    FOLLOW UP: Our staff will call the number listed on your records the next business day following your procedure.  We will call around 7:15- 8:00 am to check on you and address any questions or concerns that you may have regarding the information given to you following your procedure. If we do not reach you, we will leave a message.  If you develop any symptoms (ie: fever, flu-like symptoms, shortness of breath, cough etc.) before then, please call [phone].  If you test positive for Covid 19 in the 2 weeks post procedure, please call and report this information to [place].    If any biopsies were taken you will be contacted by phone or by letter within the next 1-3 weeks.  Please call us at [phone] if you have not heard about the biopsies in 3 weeks.    SIGNATURES/CONFIDENTIALITY: You and/or your care partner have signed paperwork which will be entered into your electronic medical record.  These signatures attest to the fact that that the information above on your After Visit Summary has been reviewed and is understood.  Full responsibility of the confidentiality of this discharge information lies with you and/or your care-partner.

## 2021-11-30 NOTE — Progress Notes (Signed)
GASTROENTEROLOGY PROCEDURE H&P NOTE   Primary Care Physician: Patrick Infante, MD    Reason for Procedure:   Hx of colon polyps  Plan:    Colonoscopy  Patient is appropriate for endoscopic procedure(s) in the ambulatory ([facility]) setting.  The nature of the procedure, as well as the risks, benefits, and alternatives were carefully and thoroughly reviewed with the patient. Ample time for discussion and questions allowed. The patient understood, was satisfied, and agreed to proceed.     HPI: Patrick Hoffman is a 61 y.o. male who presents for surveillance colonoscopy.  Medical history as below.  Tolerated the prep.  No recent chest pain or shortness of breath.  No abdominal pain today.  Past Medical History:  Diagnosis Date   Allergic rhinitis    Anxiety    Asthma    Broken shoulder    right shoulder approx 10 years ago   Bronchitis    Gilbert's syndrome    Gout    H/O asbestos exposure    History of stomach ulcers    Hypertension    Lymphadenopathy    Migraine    Nephrolithiasis    OA (osteoarthritis) of knee    bilat   Obesity    Sarcoidosis of lung ([place])    Shingles    on face, age 45yo and again at 61yo    Sleep apnea    no cpap    Past Surgical History:  Procedure Laterality Date   COLONOSCOPY  08/21/2014   Dr.Christerpher Hoffman   kidney stones     MEDIASTINOSCOPY  02/05/2012   Procedure: MEDIASTINOSCOPY;  Surgeon: Patrick Nakayama, MD;  Location: [facility];  Service: Thoracic;  Laterality: N/A;   POLYPECTOMY     TESTICLE SURGERY     TONSILLECTOMY     WISDOM TOOTH EXTRACTION     no sedation    Prior to Admission medications   Medication Sig Start Date End Date Taking? Authorizing Provider  allopurinol (ZYLOPRIM) 300 MG tablet Take 300 mg by mouth daily.  01/16/12  Yes [provider]  amLODipine (NORVASC) 5 MG tablet Take 5 mg by mouth daily.  01/06/12  Yes [provider]  COD LIVER OIL PO Take by mouth.   Yes [provider]  losartan  (COZAAR) 100 MG tablet Take 100 mg by mouth daily.  01/16/12  Yes [provider]  Multiple Vitamin (MULTIVITAMIN) tablet Take 1 tablet by mouth daily.   Yes [provider]  rosuvastatin (CRESTOR) 10 MG tablet Take 10 mg by mouth at bedtime. 07/23/21  Yes [provider]  Turmeric (QC TUMERIC COMPLEX PO) Take by mouth.   Yes [provider]    Current Outpatient Medications  Medication Sig Dispense Refill   allopurinol (ZYLOPRIM) 300 MG tablet Take 300 mg by mouth daily.      amLODipine (NORVASC) 5 MG tablet Take 5 mg by mouth daily.      COD LIVER OIL PO Take by mouth.     losartan (COZAAR) 100 MG tablet Take 100 mg by mouth daily.      Multiple Vitamin (MULTIVITAMIN) tablet Take 1 tablet by mouth daily.     rosuvastatin (CRESTOR) 10 MG tablet Take 10 mg by mouth at bedtime.     Turmeric (QC TUMERIC COMPLEX PO) Take by mouth.     Current Facility-Administered Medications  Medication Dose Route Frequency Provider Last Rate Last Admin   0.9 %  sodium chloride infusion  500 mL Intravenous Once Patrick Hoffman,  Patrick Lines, MD        Allergies as of 11/30/2021   (No Known Allergies)    Family History  Problem Relation Age of Onset   COPD Mother    Hypertension Mother    COPD Father    Hypertension Father    Hypertension Brother    Breast cancer Maternal Grandmother    Heart disease Paternal Grandfather    Colon cancer Neg Hx    Rectal cancer Neg Hx    Stomach cancer Neg Hx    Esophageal cancer Neg Hx     Social History   Socioeconomic History   Marital status: Married    Spouse name: Patrick Hoffman   Number of children: 1   Years of education: 12   Highest education level: Not on file  Occupational History   Occupation: Loss adjuster, chartered: [employer]  Tobacco Use   Smoking status: Former    Packs/day: 1.50    Years: 20.00    Total pack years: 30.00    Types: Cigarettes    Quit date: 04/17/2009    Years since quitting:  12.6   Smokeless tobacco: Never  Vaping Use   Vaping Use: Never used  Substance and Sexual Activity   Alcohol use: Not Currently    Comment: rare   Drug use: No   Sexual activity: Not on file  Other Topics Concern   Not on file  Social History Narrative   Patient is married Patrick Hoffman).   Patient has one daughter.   Hunt and fish, watch grandchild   Social Determinants of Health   Financial Resource Strain: Not on file  Food Insecurity: Not on file  Transportation Needs: Not on file  Physical Activity: Not on file  Stress: Not on file  Social Connections: Not on file  Intimate Partner Violence: Not on file    Physical Exam: Vital signs in last 24 hours: '@BP'$  116/85   Pulse 89   Temp 99.6 F (37.6 C) (Temporal)   Resp 19   Ht '5\' 10"'$  (1.778 m)   Wt 265 lb (120.2 kg)   SpO2 95%   BMI 38.02 kg/m  GEN: NAD EYE: Sclerae anicteric ENT: MMM CV: Non-tachycardic Pulm: CTA b/l GI: Soft, NT/ND NEURO:  Alert & Oriented x 3   Patrick Jarred, MD [facility]  11/30/2021 8:24 AM

## 2021-11-30 NOTE — Op Note (Signed)
[facility] Patient Name: Patrick Hoffman Procedure Date: 11/30/2021 8:04 AM MRN: [id number] Endoscopist: Jerene Bears , MD Age: 61 Referring MD:  Date of Birth: 10/18/1960 Gender: Male Account #: [id number] Procedure:                Colonoscopy Indications:              High risk colon cancer surveillance: Personal                            history of non-advanced adenoma, Last colonoscopy:                            May 2016 Medicines:                Monitored Anesthesia Care Procedure:                Pre-Anesthesia Assessment:                           - Prior to the procedure, a History and Physical                            was performed, and patient medications and                            allergies were reviewed. The patient's tolerance of                            previous anesthesia was also reviewed. The risks                            and benefits of the procedure and the sedation                            options and risks were discussed with the patient.                            All questions were answered, and informed consent                            was obtained. Prior Anticoagulants: The patient has                            taken no previous anticoagulant or antiplatelet                            agents. ASA Grade Assessment: II - A patient with                            mild systemic disease. After reviewing the risks                            and benefits, the patient was deemed in  satisfactory condition to undergo the procedure.                           After obtaining informed consent, the colonoscope                            was passed under direct vision. Throughout the                            procedure, the patient's blood pressure, pulse, and                            oxygen saturations were monitored continuously. The                            CF [id number] #[id number] was introduced through the anus                             and advanced to the cecum, identified by                            appendiceal orifice and ileocecal valve. The                            colonoscopy was performed without difficulty. The                            patient tolerated the procedure well. The quality                            of the bowel preparation was good. The ileocecal                            valve, appendiceal orifice, and rectum were                            photographed. Scope In: 8:13:13 AM Scope Out: 8:23:16 AM Scope Withdrawal Time: 0 hours 8 minutes 10 seconds  Total Procedure Duration: 0 hours 10 minutes 3 seconds  Findings:                 The digital rectal exam was normal.                           Multiple small and large-mouthed diverticula were                            found in the sigmoid colon, descending colon and                            ascending colon.                           Internal hemorrhoids were found during  retroflexion. The hemorrhoids were small.                           The exam was otherwise without abnormality. Complications:            No immediate complications. Estimated Blood Loss:     Estimated blood loss: none. Impression:               - Moderate diverticulosis in the sigmoid colon, in                            the descending colon and in the ascending colon.                           - Small internal hemorrhoids.                           - The examination was otherwise normal.                           - No specimens collected. Recommendation:           - Patient has a contact number available for                            emergencies. The signs and symptoms of potential                            delayed complications were discussed with the                            patient. Return to normal activities tomorrow.                            Written discharge instructions were provided to the                             patient.                           - Resume previous diet.                           - Continue present medications.                           - Repeat colonoscopy in 10 years for surveillance. Jerene Bears, MD 11/30/2021 8:26:36 AM This report has been signed electronically.

## 2021-12-01 ENCOUNTER — Telehealth: Payer: Self-pay

## 2021-12-01 DIAGNOSIS — R7301 Impaired fasting glucose: Secondary | ICD-10-CM | POA: Diagnosis not present

## 2021-12-01 NOTE — Telephone Encounter (Signed)
  Follow up Call-     11/30/2021    7:20 AM  Call back number  Post procedure Call Back phone  # [phone]  Permission to leave phone message Yes     Patient questions:  Do you have a fever, pain , or abdominal swelling? Yes.   Pain Score  0 *  Have you tolerated food without any problems? Yes.    Have you been able to return to your normal activities? Yes.    Do you have any questions about your discharge instructions: Diet   No. Medications  No. Follow up visit  No.  Do you have questions or concerns about your Care? No.  Actions: * If pain score is 4 or above: No action needed, pain <4.

## 2022-01-01 DIAGNOSIS — U071 COVID-19: Secondary | ICD-10-CM | POA: Diagnosis not present

## 2022-03-02 DIAGNOSIS — R7301 Impaired fasting glucose: Secondary | ICD-10-CM | POA: Diagnosis not present

## 2022-03-31 DIAGNOSIS — Z23 Encounter for immunization: Secondary | ICD-10-CM | POA: Diagnosis not present

## 2022-03-31 DIAGNOSIS — I10 Essential (primary) hypertension: Secondary | ICD-10-CM | POA: Diagnosis not present

## 2022-06-08 DIAGNOSIS — R7301 Impaired fasting glucose: Secondary | ICD-10-CM | POA: Diagnosis not present

## 2022-06-08 DIAGNOSIS — I2510 Atherosclerotic heart disease of native coronary artery without angina pectoris: Secondary | ICD-10-CM | POA: Diagnosis not present

## 2022-06-08 DIAGNOSIS — I10 Essential (primary) hypertension: Secondary | ICD-10-CM | POA: Diagnosis not present

## 2022-12-22 DIAGNOSIS — Z125 Encounter for screening for malignant neoplasm of prostate: Secondary | ICD-10-CM | POA: Diagnosis not present

## 2022-12-22 DIAGNOSIS — R7301 Impaired fasting glucose: Secondary | ICD-10-CM | POA: Diagnosis not present

## 2022-12-22 DIAGNOSIS — E785 Hyperlipidemia, unspecified: Secondary | ICD-10-CM | POA: Diagnosis not present

## 2022-12-22 DIAGNOSIS — M109 Gout, unspecified: Secondary | ICD-10-CM | POA: Diagnosis not present

## 2023-01-01 DIAGNOSIS — Z0000 Encounter for general adult medical examination without abnormal findings: Secondary | ICD-10-CM | POA: Diagnosis not present

## 2023-01-01 DIAGNOSIS — Z23 Encounter for immunization: Secondary | ICD-10-CM | POA: Diagnosis not present

## 2023-01-01 DIAGNOSIS — I700 Atherosclerosis of aorta: Secondary | ICD-10-CM | POA: Diagnosis not present

## 2023-01-01 DIAGNOSIS — I10 Essential (primary) hypertension: Secondary | ICD-10-CM | POA: Diagnosis not present

## 2023-01-01 DIAGNOSIS — R82998 Other abnormal findings in urine: Secondary | ICD-10-CM | POA: Diagnosis not present

## 2023-01-17 DIAGNOSIS — I2510 Atherosclerotic heart disease of native coronary artery without angina pectoris: Secondary | ICD-10-CM | POA: Diagnosis not present

## 2023-03-16 IMAGING — CT CT CARDIAC CORONARY ARTERY CALCIUM SCORE
3 series · 14 of 20 positions shown, 16 images · non-contrast
Comparison: Chest CT 06/18/2017.

CLINICAL DATA: 60-year-old Caucasian male with history of elevated
cholesterol level. Former smoker. Family history of coronary artery
disease.

EXAM:
CT CARDIAC CORONARY ARTERY CALCIUM SCORE
TECHNIQUE: Non-contrast imaging through the heart was performed using
prospective ECG gating. Image post processing was performed on an
independent workstation, allowing for quantitative analysis of the
heart and coronary arteries. Note that this exam targets the heart
and the chest was not imaged in its entirety.

[Series 2: calcium scoring 2.00 qr36 bestdiast 52% hrt calciu · axial · 0.49mm/px · z∈[+1807,+1891]mm · 4 of 70 slices shown]
[im 14/70  vessel]
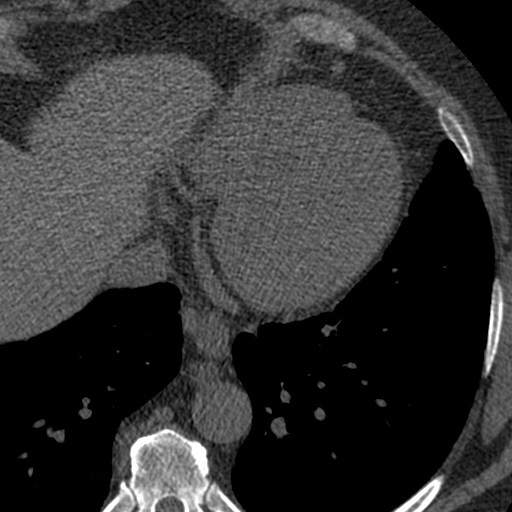
[im 28/70  vessel]
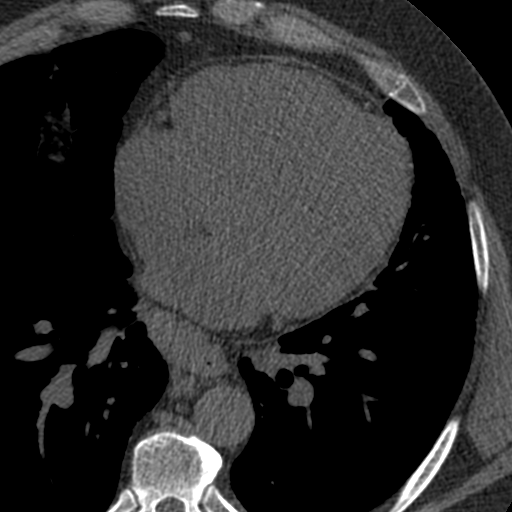
[im 42/70  vessel]
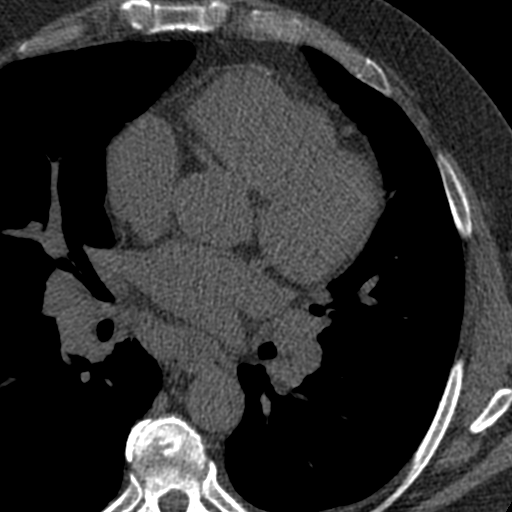
[im 56/70  vessel]
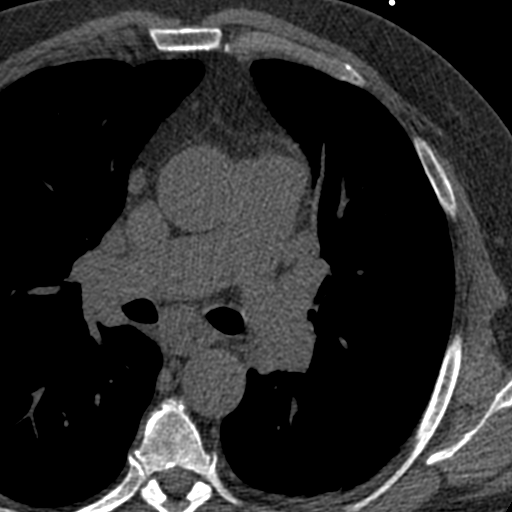

[Series 3: calcium scoring 2.00 br40 bestdiast 52% axial · axial · 0.70mm/px · z∈[+1803,+1895]mm · 5 of 70 slices shown, 7 images]
[im 12/70  vessel]
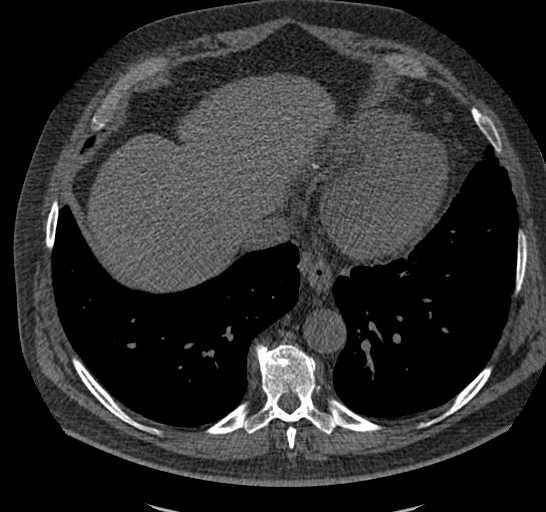
[im 12/70  lung]
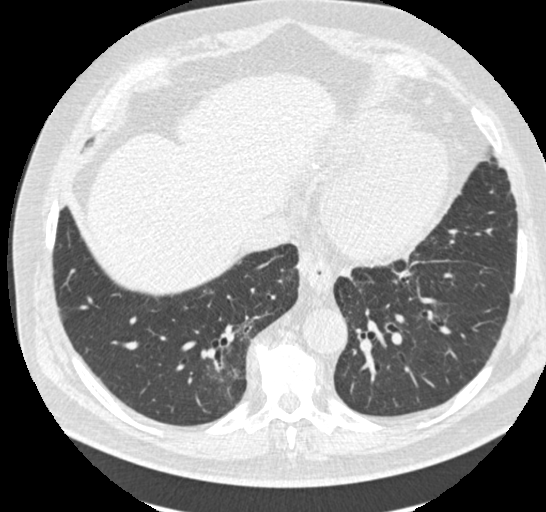
[im 24/70  vessel]
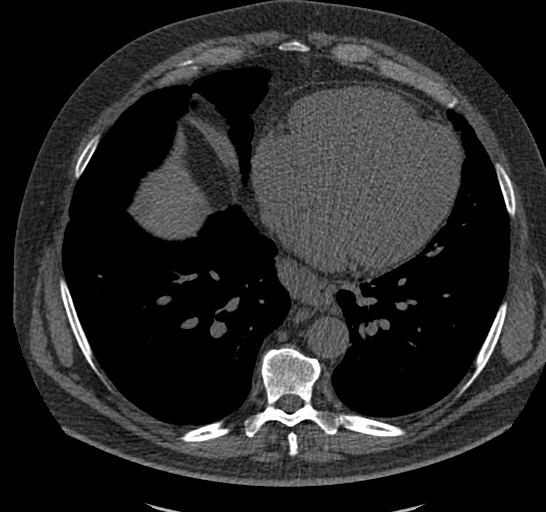
[im 35/70  vessel]
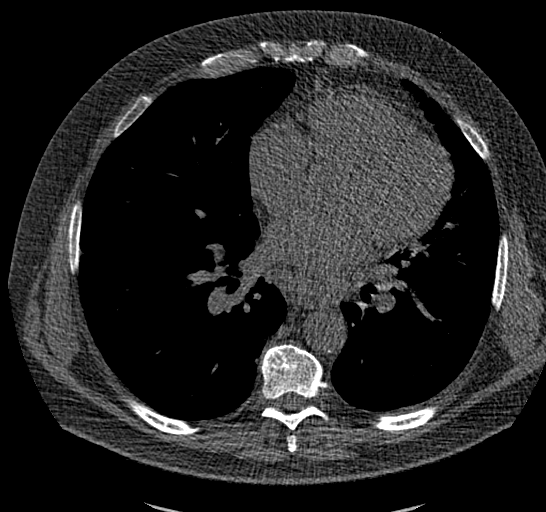
[im 47/70  vessel]
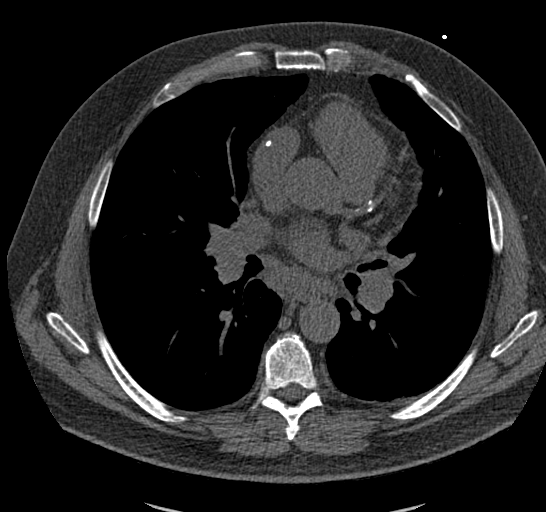
[im 58/70  vessel]
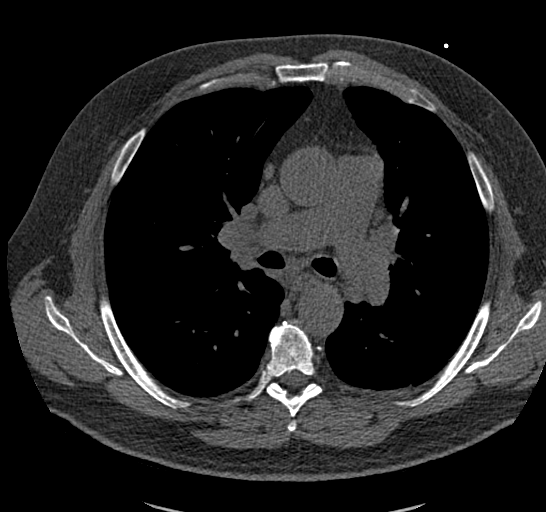
[im 58/70  lung]
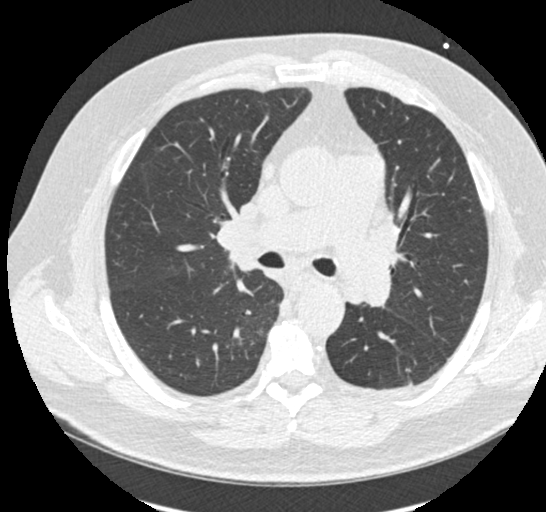

[Series 9: calcium scoring 2.00 br60 bestdiast 52% lungs · axial · 0.66mm/px · z∈[+1803,+1895]mm · 5 of 70 slices shown]
[im 12/70  vessel]
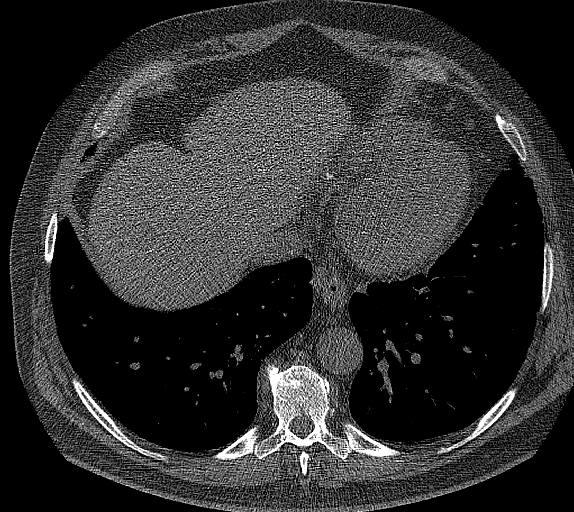
[im 24/70  vessel]
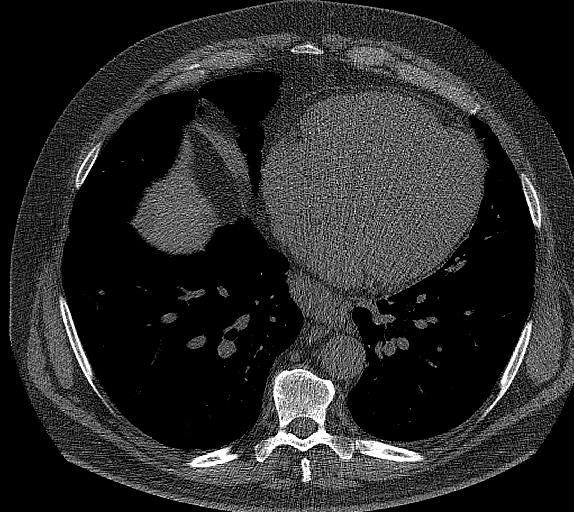
[im 35/70  vessel]
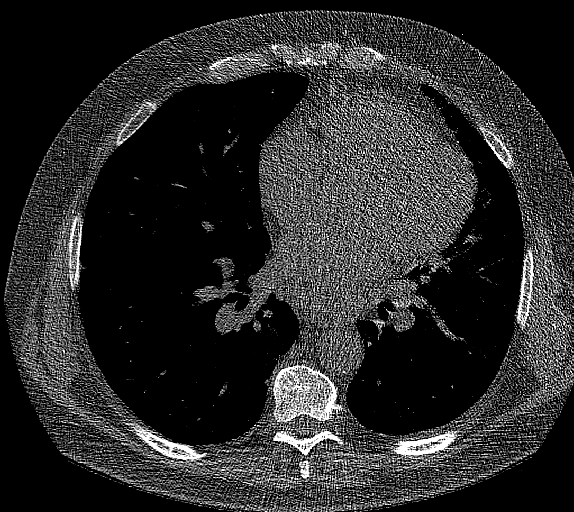
[im 47/70  vessel]
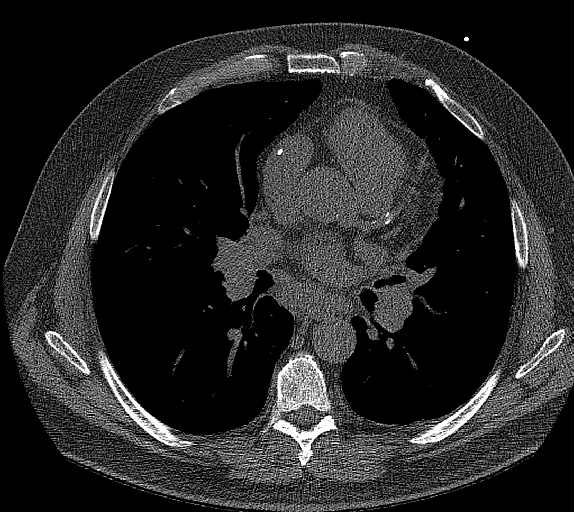
[im 58/70  vessel]
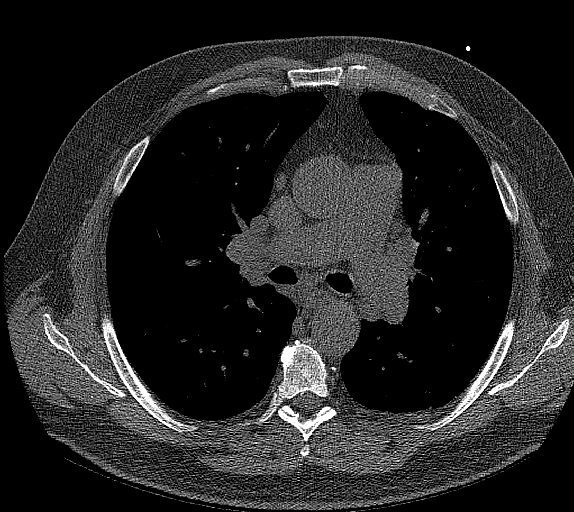

[14 of 20 positions shown; findings below may reference images not displayed]

FINDINGS: CORONARY CALCIUM SCORES:

Left Main: 0

LAD: 149

LCx: 0

RCA: 28

Total Agatston Score: 177

[HOSPITAL] percentile: 72nd

AORTA MEASUREMENTS:

Ascending Aorta: 40 mm

Descending Aorta: 34 mm

EXTRACARDIAC FINDINGS:

Atherosclerotic calcifications in the thoracic aorta. Patchy areas
of ground-glass attenuation, septal thickening and thickening of the
peribronchovascular interstitium are noted in the lungs bilaterally,
similar to prior study from 06/18/2017, presumably secondary to
alveolar sarcoid. Within the visualized portions of the thorax there
are no suspicious appearing pulmonary nodules or masses, there is no
acute consolidative airspace disease, no pleural effusions and no
pneumothorax. Multiple prominent borderline enlarged and mildly
enlarged mediastinal and bilateral hilar lymph nodes are noted,
compatible with reported clinical history of sarcoidosis. Visualized
portions of the upper abdomen are unremarkable. There are no
aggressive appearing lytic or blastic lesions noted in the
visualized portions of the skeleton.
IMPRESSION: 1. Patient's total coronary artery calcium score is 177 which is
72nd percentile for patient's of matched age, gender and
race/ethnicity. Please note that although the presence of coronary
artery calcium documents the presence of coronary artery disease,
the severity of this disease and any potential stenosis cannot be
assessed on this noncontrast CT examination. Assessment for
potential risk factor modification, dietary therapy or pharmacologic
therapy may be warranted, if clinically indicated.
2. Imaging stigmata of sarcoidosis similar to prior examinations, as
above.

## 2023-05-25 DIAGNOSIS — R059 Cough, unspecified: Secondary | ICD-10-CM | POA: Diagnosis not present

## 2023-05-25 DIAGNOSIS — Z20828 Contact with and (suspected) exposure to other viral communicable diseases: Secondary | ICD-10-CM | POA: Diagnosis not present

## 2023-05-25 DIAGNOSIS — R52 Pain, unspecified: Secondary | ICD-10-CM | POA: Diagnosis not present

## 2023-05-25 NOTE — Progress Notes (Signed)
 Subjective:   Patrick Hoffman is a 63 y.o. male who presents for evaluation of acute illness, starting last night Congestion, myalgias, cough, wheeze Has tried OTC cold prep, helped aches some + exposure to influenza- wife has been sick for 5 days and was seen here yesterday by me with + rapid test   Past Medical History:  Diagnosis Date  . Gout   . Hypertension     Patient Active Problem List  Diagnosis  . Sarcoidosis  . Pain in left knee  . Hypertension  . Gouty arthritis  . Cough variant asthma    Current Outpatient Medications on File Prior to Visit  Medication Sig Dispense Refill  . allopurinoL (ZYLOPRIM) 300 mg tablet     . amLODIPine (NORVASC) 5 mg tablet     . cod liver oiL solution Take  by mouth.    . furosemide  (LASIX ) 20 mg tablet     . losartan (COZAAR) 50 mg tablet losartan 50 mg tablet    . multivitamin (THERAGRAN) tab tablet Take 1 tablet by mouth Once Daily.    . Ozempic 1 mg/dose (4 mg/3 mL) pnij     . potassium chloride (KLOR-CON) 10 mEq ER tablet potassium chloride ER 10 mEq tablet,extended release    . rosuvastatin (CRESTOR) 10 mg tablet     . sodium,potassium,mag sulfates (SUPREP BOWEL PREP KIT) 17.5-3.13-1.6 gram solr     . turmeric 400 mg cap Take  by mouth.    . [DISCONTINUED] clobetasoL (TEMOVATE) 0.05 % ointment AAA BID up to 1 week 60 g 0  . [DISCONTINUED] diclofenac (VOLTAREN) 50 mg EC tablet Take 50 mg by mouth 2 (two) times a day for 14 days. 28 tablet 0  . [DISCONTINUED] hydrOXYzine (ATARAX) 25 mg tablet Take 25 mg by mouth 3 (three) times a day as needed. Indications: itching 30 tablet 0  . [DISCONTINUED] nirmatrelvir-ritonavir (Paxlovid) 300 mg (150 mg x 2)-100 mg dose pack Take 300 mg nirmatrelvir (two 150 mg tablets) with 100 mg ritonavir (one 100 mg tablet) by mouth twice daily for 5 days. 30 tablet 0   No current facility-administered medications on file prior to visit.    Not on File    Review of Systems Pertinent items are  noted in HPI.   Objective:   Vitals:   05/25/23 0907 05/25/23 0908  BP: 144/86 142/80  BP Location: Right arm Right arm  Patient Position: Sitting Sitting  Pulse: 106   Temp: 98.5 F (36.9 C)   TempSrc: Temporal   SpO2: 95%   Weight: 117 kg (258 lb)   Height: 1.753 m (5' 9)     Vitals as noted above. Appears ill. HENT:Ears: both TM's dull and retracted and normal canals bilaterally Nose: mucosal edema and congestion Oropharynx:no abnormalities, moist mucosa Neck: supple, no significant adenopathy Lungs: frequent cough. Lungs clear.  CV RRR      Assessment/Plan:   1. Influenza-like illness  oseltamivir (TAMIFLU) 75 mg capsule   albuterol  HFA (PROVENTIL  HFA;VENTOLIN  HFA;PROAIR  HFA) 90 mcg/actuation inhaler   benzonatate (TESSALON) 200 mg capsule   promethazine -dextromethorphan (PHENERGAN  DM) 6.25-15 mg/5 mL syrp syrup    2. Body aches  POC Rapid Influenza A & B Antigen     Rapid flu negative, but we discussed high likelihood of false negative testing so early in illness Hx cough-variant asthma and sarcoidosis- recommend antiviral therapy Rx as above Recheck in person if breathing getting worse instead of better over the next few days  Electronically signed by: Corean Thayne Geralds, PA-C 05/25/2023 9:07 AM

## 2023-07-27 DIAGNOSIS — E785 Hyperlipidemia, unspecified: Secondary | ICD-10-CM | POA: Diagnosis not present

## 2023-07-27 DIAGNOSIS — M109 Gout, unspecified: Secondary | ICD-10-CM | POA: Diagnosis not present

## 2023-07-27 DIAGNOSIS — E1169 Type 2 diabetes mellitus with other specified complication: Secondary | ICD-10-CM | POA: Diagnosis not present

## 2023-12-06 DIAGNOSIS — E785 Hyperlipidemia, unspecified: Secondary | ICD-10-CM | POA: Diagnosis not present

## 2023-12-06 DIAGNOSIS — R0683 Snoring: Secondary | ICD-10-CM | POA: Diagnosis not present

## 2023-12-06 DIAGNOSIS — I700 Atherosclerosis of aorta: Secondary | ICD-10-CM | POA: Diagnosis not present

## 2023-12-06 DIAGNOSIS — E1169 Type 2 diabetes mellitus with other specified complication: Secondary | ICD-10-CM | POA: Diagnosis not present

## 2023-12-06 DIAGNOSIS — I2510 Atherosclerotic heart disease of native coronary artery without angina pectoris: Secondary | ICD-10-CM | POA: Diagnosis not present

## 2023-12-06 DIAGNOSIS — K760 Fatty (change of) liver, not elsewhere classified: Secondary | ICD-10-CM | POA: Diagnosis not present

## 2023-12-06 DIAGNOSIS — I10 Essential (primary) hypertension: Secondary | ICD-10-CM | POA: Diagnosis not present

## 2024-01-26 DIAGNOSIS — K644 Residual hemorrhoidal skin tags: Secondary | ICD-10-CM | POA: Diagnosis not present

## 2024-03-10 DIAGNOSIS — E7849 Other hyperlipidemia: Secondary | ICD-10-CM | POA: Diagnosis not present

## 2024-03-10 DIAGNOSIS — Z1212 Encounter for screening for malignant neoplasm of rectum: Secondary | ICD-10-CM | POA: Diagnosis not present

## 2024-04-24 ENCOUNTER — Ambulatory Visit: Attending: Cardiovascular Disease | Admitting: Internal Medicine

## 2024-04-24 ENCOUNTER — Encounter: Payer: Self-pay | Admitting: Internal Medicine

## 2024-04-24 ENCOUNTER — Other Ambulatory Visit (HOSPITAL_COMMUNITY): Payer: Self-pay

## 2024-04-24 VITALS — BP 154/100 | HR 107 | Ht 70.0 in | Wt 269.9 lb

## 2024-04-24 DIAGNOSIS — I2510 Atherosclerotic heart disease of native coronary artery without angina pectoris: Secondary | ICD-10-CM | POA: Diagnosis not present

## 2024-04-24 DIAGNOSIS — E119 Type 2 diabetes mellitus without complications: Secondary | ICD-10-CM | POA: Insufficient documentation

## 2024-04-24 DIAGNOSIS — Z7689 Persons encountering health services in other specified circumstances: Secondary | ICD-10-CM | POA: Diagnosis not present

## 2024-04-24 DIAGNOSIS — I10 Essential (primary) hypertension: Secondary | ICD-10-CM | POA: Insufficient documentation

## 2024-04-24 DIAGNOSIS — E1169 Type 2 diabetes mellitus with other specified complication: Secondary | ICD-10-CM

## 2024-04-24 DIAGNOSIS — R9431 Abnormal electrocardiogram [ECG] [EKG]: Secondary | ICD-10-CM

## 2024-04-24 DIAGNOSIS — G4710 Hypersomnia, unspecified: Secondary | ICD-10-CM | POA: Insufficient documentation

## 2024-04-24 DIAGNOSIS — E781 Pure hyperglyceridemia: Secondary | ICD-10-CM | POA: Diagnosis not present

## 2024-04-24 DIAGNOSIS — D869 Sarcoidosis, unspecified: Secondary | ICD-10-CM | POA: Diagnosis not present

## 2024-04-24 LAB — BASIC METABOLIC PANEL WITH GFR
BUN/Creatinine Ratio: 20 (ref 10–24)
BUN: 17 mg/dL (ref 8–27)
CO2: 21 mmol/L (ref 20–29)
Calcium: 9.1 mg/dL (ref 8.6–10.2)
Chloride: 106 mmol/L (ref 96–106)
Creatinine, Ser: 0.85 mg/dL (ref 0.76–1.27)
Glucose: 129 mg/dL — ABNORMAL HIGH (ref 70–99)
Potassium: 4.6 mmol/L (ref 3.5–5.2)
Sodium: 143 mmol/L (ref 134–144)
eGFR: 96 mL/min/1.73

## 2024-04-24 MED ORDER — METOPROLOL TARTRATE 50 MG PO TABS
50.0000 mg | ORAL_TABLET | Freq: Once | ORAL | 0 refills | Status: AC
  Filled 2024-04-24: qty 1, 1d supply, fill #0

## 2024-04-24 MED ORDER — ASPIRIN 81 MG PO TBEC: 81.0000 mg | DELAYED_RELEASE_TABLET | Freq: Every day | ORAL | Status: DC

## 2024-04-24 MED ORDER — ASPIRIN 81 MG PO TBEC: 81.0000 mg | DELAYED_RELEASE_TABLET | Freq: Every day | ORAL | 3 refills | Status: AC

## 2024-04-24 NOTE — Progress Notes (Signed)
 " Cardiology Office Note:  .   Date:  04/24/2024  ID:  Patrick Hoffman, DOB 1960/12/01, MRN [id number] PCP: Patrick Anes, MD  [facility] Providers Cardiologist:  Patrick FORBES Calender, DO    History of Present Illness: .    Discussed the use of AI scribe software for clinical note transcription with the patient, who gave verbal consent to proceed.  History of Present Illness Patrick Hoffman is a 64 year old male with hypertension and coronary artery disease who presents with an abnormal ECG. He is accompanied by his wife. He was referred by his PCP for evaluation of an abnormal ECG.  He has a history of hypertension and currently takes losartan 50 mg twice daily. He reports that his blood pressure has been coming down at home, although it was high today, which he attributes to 'white coat syndrome'. Home blood pressure readings range from 125/75 to 143/80 mmHg.  He reports an abnormal ECG from his PCP, which noted PVCs and possibly an old infarct. He recalls an episode over 40 years ago where he felt his heart 'dropped twice' after a day of physical activity, leaving him unwell for about two weeks. He did not seek medical attention at that time. He has a family history of heart problems on his father's side, including a grandfather who died of clogged arteries at 72 and an uncle with a pacemaker.  He has type 2 diabetes, with an A1C that has decreased from 6.9% to 6.6% since starting Mounjaro. He is also on Crestor, with a recent lipid panel showing total cholesterol of 80 mg/dL, triglycerides of 393 mg/dL, and HDL of 18 mg/dL. He has a history of very low LDL levels and high triglycerides; his brother has similar issues.  He has sarcoidosis of the lung and reports no recent flare-ups. He follows up with a lung doctor and had a chest X-ray 6-7 months ago.  He has a history of gout and kidney stones, with the latter requiring hospitalization in his mid-fifties.  He quit smoking 15-20 years ago  after previously smoking two packs a day. He does not consume alcohol  regularly, having not drunk in the past two years.  He experiences fatigue, particularly in the afternoon, and has recently retired from a physically demanding job. He reports a significant decrease in physical activity since retirement, now only walking his dog every other day. No chest pain or shortness of breath during these walks.  He is concerned about his weight, noting that he is currently at his heaviest at 268-269 lbs, compared to his usual weight of 220-225 lbs. He has tried various diets without success and is attempting to cut out sweets and fried foods.  He reports a low heart rate upon waking, between 44-54 bpm, which increases after being awake for a couple of hours. No dizziness or faintness with this low heart rate.      ROS: Remaining review of systems negative  Studies Reviewed: Patrick Hoffman   EKG Interpretation Date/Time:  Thursday April 24 2024 09:14:44 EST Ventricular Rate:  107 PR Interval:  186 QRS Duration:  104 QT Interval:  346 QTC Calculation: 461 R Axis:   8  Text Interpretation: Sinus tachycardia with Fusion complexes Possible Inferior infarct (cited on or before 05-Feb-2012) Abnormal ECG When compared with ECG of 05-Feb-2012 06:36, Fusion complexes are now Present Confirmed by Hoffman Patrick [phone]) on 04/24/2024 9:16:54 AM    Results Labs Lipid panel (03/10/2024): Total cholesterol 80, triglycerides 606, HDL  18, LDL not calculated, apolipoprotein B 44  Radiology CT coronary calcium score (08/02/2020): Total calcium score 177 (72nd percentile); left main 0, LAD 149, left circumflex 0, RCA 28; descending aorta 40 mm, descending aorta 34 mm  Diagnostic ECG: Premature ventricular contractions and possible old infarct, similar to 2023 ECG Risk Assessment/Calculations:     STOP-Bang Score:  7      Physical Exam:   VS:  BP (!) 154/100 (BP Location: Left Arm, Patient Position: Sitting, Cuff Size:  Large)   Pulse (!) 107   Ht 5' 10 (1.778 m)   Wt 269 lb 14.4 oz (122.4 kg)   SpO2 96%   BMI 38.73 kg/m    Wt Readings from Last 3 Encounters:  04/24/24 269 lb 14.4 oz (122.4 kg)  11/30/21 265 lb (120.2 kg)  10/17/21 265 lb (120.2 kg)    GEN: Well nourished, well developed in no acute distress NECK: No JVD; No carotid bruits CARDIAC:  RRR, no murmurs, no rubs, no gallops RESPIRATORY:  Clear to auscultation without rales, wheezing or rhonchi  ABDOMEN: Soft, non-tender, non-distended EXTREMITIES:  No edema; No deformity   ASSESSMENT AND PLAN: .    Assessment and Plan Assessment & Plan Evaluation of abnormal electrocardiogram in patient with coronary artery disease Abnormal ECG with PVCs and possible old infarct, PCP note reports this is consistent with previous ECGs, no recent studies available. No definitive evidence of past myocardial infarction. - Ordered echocardiogram to assess cardiac function. - Coronary CTA to evaluate for hemodynamic compromise  Coronary artery disease with moderate coronary artery calcification Moderate coronary artery calcification with calcium score of 177. No current symptoms of angina or dyspnea. - Initiated aspirin  81 mg daily. - Discussed potential use of Pepcid  or omeprazole  if aspirin  causes gastrointestinal discomfort.  Hypertension Managed with amlodipine 5 mg, losartan 50 mg twice daily, Lasix  20 mg 3 times weekly. Home blood pressure ranges from 125/75 to 143/80 mmHg. Recent addition of Lasix  effective.  High suspicion for undiagnosed sleep apnea.  Possible white coat syndrome. - Continue current antihypertensive regimen. - Monitor blood pressure at home. - Recommended a sleep study but patient declined at this time - Consider switching Lasix  to hydrochlorothiazide    Type 2 diabetes mellitus Recent improvement in HbA1c from 6.9% to 6.0% with Mounjaro. Ongoing weight management. - Continue Mounjaro for diabetes management. - Encouraged  weight loss and dietary modifications.  Obesity Current weight around 268-269 lbs. Recent increase in Mounjaro dosage to aid weight loss. High intake of sweets and fried foods. - Encouraged dietary modifications to reduce intake of sweets and fried foods. - Continue Mounjaro for weight management.  Hyperlipidemia with hypertriglyceridemia Elevated triglycerides at 606 mg/dL. Currently on rosuvastatin. Discussed risk of pancreatitis with high triglycerides. - Continue rosuvastatin for lipid management. - Encouraged dietary modifications to reduce intake of sweets and fried foods.  Sarcoidosis of lung, in remission Sarcoidosis in remission with no recent flare-ups. - Continue follow-up with pulmonologist as needed.  Former tobacco use            Follow up: 3 months  Signed, Patrick FORBES Calender, DO  04/24/2024 10:01 AM    [facility] "

## 2024-04-24 NOTE — Patient Instructions (Addendum)
 Medication Instructions:   Start taking Enteric coated Asprin 81 mg daily   *If you need a refill on your cardiac medications before your next appointment, please call your pharmacy*   Lab Work: BMP If you have labs (blood work) drawn today and your tests are completely normal, you will receive your results only by: MyChart Message (if you have MyChart) OR A paper copy in the mail If you have any lab test that is abnormal or we need to change your treatment, we will call you to review the results.   Testing/Procedures: Your physician has requested that you have coronary  CTA. Coronary computed tomography (CT)angiogram  is a special type of CT scan that uses a computer to produce multi-dimensional views of major blood vessels throughout the heart.  CT angiography, a contrast material is injected through an IV to help visualize the blood vessels  a painless test that uses an x-ray machine to take clear, detailed pictures of your heart arteries .  Please follow instruction sheet as given.    Follow-Up: At [facility], you and your health needs are our priority.  As part of our continuing mission to provide you with exceptional heart care, we have created designated Provider Care Teams.  These Care Teams include your primary Cardiologist (physician) and Advanced Practice Providers (APPs -  Physician Assistants and Nurse Practitioners) who all work together to provide you with the care you need, when you need it.     Your next appointment:   3 month(s)  The format for your next appointment:   In Person  Provider:   Emeline FORBES Calender, DO   Other Instructions     Your cardiac CT will be scheduled at the below locations:     [facility] [address]  [place], [place] [address]     If scheduled at the [facility] at [address], please enter the parking lot using the [address] entrance and use the FREE valet service at the  patient drop-off area. Enter the building and check-in with registration on the main floor.    Please follow these instructions carefully (unless otherwise directed):  An IV will be required for this test and Nitroglycerin will be given.  Hold all erectile dysfunction medications at least 3 days (72 hrs) prior to test. (Ie viagra, cialis, sildenafil, tadalafil, etc)   On the Night Before the Test: Be sure to Drink plenty of water. Do not consume any caffeinated/decaffeinated beverages or chocolate 12 hours prior to your test. Do not take any antihistamines 12 hours prior to your test.   On the Day of the Test: Drink plenty of water until 1 hour prior to the test. Do not eat any food 1 hour prior to test. You may take your regular medications prior to the test.  Take metoprolol  (Lopressor ) 50 mg two hours prior to test. If you take Furosemide , please HOLD on the morning of the test. Patients who wear a continuous glucose monitor MUST remove the device prior to scanning        After the Test: Drink plenty of water. After receiving IV contrast, you may experience a mild flushed feeling. This is normal. On occasion, you may experience a mild rash up to 24 hours after the test. This is not dangerous. If this occurs, you can take Benadryl 25 mg, Zyrtec, Claritin, or Allegra and increase your fluid intake. (Patients taking Tikosyn should avoid Benadryl, and may take  Zyrtec, Claritin, or Allegra) If you experience trouble breathing, this can be serious. If it is severe call 911 IMMEDIATELY. If it is mild, please call our office.  We will call to schedule your test 2-4 weeks out understanding that some insurance companies will need an authorization prior to the service being performed.   For more information and frequently asked questions, please visit our website : [email or web address]  For non-scheduling related questions, please contact the cardiac imaging nurse navigator  should you have any questions/concerns: Cardiac Imaging Nurse Navigators Direct Office Dial: [phone]   For scheduling needs, including cancellations and rescheduling, please call Brittany, [phone].

## 2024-04-25 ENCOUNTER — Ambulatory Visit: Payer: Self-pay | Admitting: Internal Medicine

## 2024-05-07 ENCOUNTER — Encounter (HOSPITAL_COMMUNITY): Payer: Self-pay

## 2024-05-08 ENCOUNTER — Ambulatory Visit (HOSPITAL_COMMUNITY)
Admission: RE | Admit: 2024-05-08 | Discharge: 2024-05-08 | Disposition: A | Discharge status: Home / Self Care | Source: Ambulatory Visit | Attending: Cardiology | Admitting: Cardiology

## 2024-05-08 DIAGNOSIS — Z7689 Persons encountering health services in other specified circumstances: Secondary | ICD-10-CM | POA: Diagnosis present

## 2024-05-08 DIAGNOSIS — I2510 Atherosclerotic heart disease of native coronary artery without angina pectoris: Secondary | ICD-10-CM | POA: Diagnosis present

## 2024-05-08 DIAGNOSIS — R9431 Abnormal electrocardiogram [ECG] [EKG]: Secondary | ICD-10-CM | POA: Diagnosis present

## 2024-05-08 MED ORDER — DILTIAZEM HCL 25 MG/5ML IV SOLN: 10.0000 mg | INTRAVENOUS | Status: DC | PRN

## 2024-05-08 MED ORDER — METOPROLOL TARTRATE 5 MG/5ML IV SOLN
10.0000 mg | INTRAVENOUS | Status: AC | PRN
  Administered 2024-05-08 (×2): 10 mg via INTRAVENOUS

## 2024-05-08 MED ORDER — IOHEXOL 350 MG/ML SOLN
100.0000 mL | Freq: Once | INTRAVENOUS | Status: AC | PRN
  Administered 2024-05-08: 100 mL via INTRAVENOUS

## 2024-05-08 MED ORDER — NITROGLYCERIN 0.4 MG SL SUBL: 0.8000 mg | SUBLINGUAL_TABLET | Freq: Once | SUBLINGUAL | Status: DC

## 2024-05-30 ENCOUNTER — Ambulatory Visit (HOSPITAL_COMMUNITY)
